# Patient Record
Sex: Male | Born: 1960 | Race: Black or African American | Hispanic: No | Marital: Single | Smoking: Current some day smoker
Health system: Southern US, Community
[De-identification: ages and names within clinical notes are randomized; demographics above are authoritative.]

## PROBLEM LIST (undated history)

## (undated) HISTORY — PX: ABDOMINAL SURGERY: SHX537

---

## 2001-08-06 ENCOUNTER — Emergency Department (HOSPITAL_COMMUNITY): Admission: EM | Admit: 2001-08-06 | Discharge: 2001-08-06 | Payer: Self-pay | Admitting: Emergency Medicine

## 2003-06-15 ENCOUNTER — Emergency Department (HOSPITAL_COMMUNITY): Admission: EM | Admit: 2003-06-15 | Discharge: 2003-06-16 | Payer: Self-pay | Admitting: Emergency Medicine

## 2010-04-30 ENCOUNTER — Emergency Department (HOSPITAL_BASED_OUTPATIENT_CLINIC_OR_DEPARTMENT_OTHER): Admission: EM | Admit: 2010-04-30 | Discharge: 2010-04-30 | Payer: Self-pay | Admitting: Emergency Medicine

## 2010-07-30 ENCOUNTER — Emergency Department (HOSPITAL_BASED_OUTPATIENT_CLINIC_OR_DEPARTMENT_OTHER)
Admission: EM | Admit: 2010-07-30 | Discharge: 2010-07-30 | Disposition: A | Discharge status: Home / Self Care | Payer: Self-pay | Attending: Emergency Medicine | Admitting: Emergency Medicine

## 2010-07-30 DIAGNOSIS — F17200 Nicotine dependence, unspecified, uncomplicated: Secondary | ICD-10-CM | POA: Insufficient documentation

## 2010-07-30 DIAGNOSIS — J029 Acute pharyngitis, unspecified: Secondary | ICD-10-CM | POA: Insufficient documentation

## 2010-07-30 LAB — RAPID STREP SCREEN (MED CTR MEBANE ONLY): Streptococcus, Group A Screen (Direct): NEGATIVE

## 2010-07-31 LAB — STREP A DNA PROBE: Group A Strep Probe: NEGATIVE

## 2010-10-02 ENCOUNTER — Emergency Department (HOSPITAL_COMMUNITY)
Admission: EM | Admit: 2010-10-02 | Discharge: 2010-10-03 | Disposition: A | Discharge status: Home / Self Care | Payer: Self-pay | Attending: Emergency Medicine | Admitting: Emergency Medicine

## 2010-10-02 DIAGNOSIS — R197 Diarrhea, unspecified: Secondary | ICD-10-CM | POA: Insufficient documentation

## 2010-10-02 DIAGNOSIS — R112 Nausea with vomiting, unspecified: Secondary | ICD-10-CM | POA: Insufficient documentation

## 2010-10-02 DIAGNOSIS — D72829 Elevated white blood cell count, unspecified: Secondary | ICD-10-CM | POA: Insufficient documentation

## 2010-10-02 DIAGNOSIS — R109 Unspecified abdominal pain: Secondary | ICD-10-CM | POA: Insufficient documentation

## 2010-10-03 ENCOUNTER — Emergency Department (HOSPITAL_COMMUNITY): Payer: Self-pay

## 2010-10-03 ENCOUNTER — Encounter (HOSPITAL_COMMUNITY): Payer: Self-pay

## 2010-10-03 LAB — DIFFERENTIAL
Basophils Relative: 0 % (ref 0–1)
Eosinophils Relative: 0 % (ref 0–5)
Lymphs Abs: 3.5 10*3/uL (ref 0.7–4.0)
Monocytes Absolute: 1 10*3/uL (ref 0.1–1.0)
Neutrophils Relative %: 78 % — ABNORMAL HIGH (ref 43–77)

## 2010-10-03 LAB — COMPREHENSIVE METABOLIC PANEL
Alkaline Phosphatase: 58 U/L (ref 39–117)
BUN: 14 mg/dL (ref 6–23)
CO2: 29 mEq/L (ref 19–32)
GFR calc non Af Amer: >60 mL/min (ref >60)
Glucose, Bld: 141 mg/dL — ABNORMAL HIGH (ref 70–99)
Potassium: 4.2 mEq/L (ref 3.5–5.1)
Total Bilirubin: 0.6 mg/dL (ref 0.3–1.2)
Total Protein: 7.1 g/dL (ref 6.0–8.3)

## 2010-10-03 LAB — CBC
HCT: 43.8 % (ref 39.0–52.0)
MCH: 21.8 pg — ABNORMAL LOW (ref 26.0–34.0)
MCV: 67.4 fL — ABNORMAL LOW (ref 78.0–100.0)
Platelets: 249 10*3/uL (ref 150–400)
RBC: 6.5 MIL/uL — ABNORMAL HIGH (ref 4.22–5.81)

## 2010-10-03 LAB — LIPASE, BLOOD: Lipase: 34 U/L (ref 11–59)

## 2010-10-03 MED ORDER — IOHEXOL 300 MG/ML  SOLN
100.0000 mL | Freq: Once | INTRAMUSCULAR | Status: AC | PRN
  Administered 2010-10-03: 100 mL via INTRAVENOUS

## 2012-05-07 ENCOUNTER — Encounter (HOSPITAL_BASED_OUTPATIENT_CLINIC_OR_DEPARTMENT_OTHER): Payer: Self-pay | Admitting: *Deleted

## 2012-05-07 ENCOUNTER — Emergency Department (HOSPITAL_BASED_OUTPATIENT_CLINIC_OR_DEPARTMENT_OTHER)
Admission: EM | Admit: 2012-05-07 | Discharge: 2012-05-07 | Disposition: A | Discharge status: Home / Self Care | Payer: Self-pay | Attending: Emergency Medicine | Admitting: Emergency Medicine

## 2012-05-07 DIAGNOSIS — L02219 Cutaneous abscess of trunk, unspecified: Secondary | ICD-10-CM | POA: Insufficient documentation

## 2012-05-07 DIAGNOSIS — L03319 Cellulitis of trunk, unspecified: Secondary | ICD-10-CM | POA: Insufficient documentation

## 2012-05-07 DIAGNOSIS — F17200 Nicotine dependence, unspecified, uncomplicated: Secondary | ICD-10-CM | POA: Insufficient documentation

## 2012-05-07 DIAGNOSIS — L0291 Cutaneous abscess, unspecified: Secondary | ICD-10-CM

## 2012-05-07 MED ORDER — DOXYCYCLINE HYCLATE 100 MG PO TABS: 100.0000 mg | ORAL_TABLET | Freq: Two times a day (BID) | ORAL | Status: DC

## 2012-05-07 MED ORDER — LIDOCAINE HCL 2 % IJ SOLN
10.0000 mL | Freq: Once | INTRAMUSCULAR | Status: AC
  Administered 2012-05-07: 100 mg via INTRADERMAL
  Filled 2012-05-07: qty 20

## 2012-05-07 MED ORDER — NAPROXEN 500 MG PO TABS: 500.0000 mg | ORAL_TABLET | Freq: Two times a day (BID) | ORAL | Status: DC

## 2012-05-07 NOTE — ED Provider Notes (Signed)
History    CSN: [id number] Arrival date & time 05/07/12  1632First MD Initiated Contact with Patient 05/07/12 1643     Chief Complaint  Patient presents with  . Abscess    HPI The patient states he's had a sore on his abdomen for the last 2 weeks. It started off as a small bump. It has been slowly increasing in size since it started.  The patient has been applying fatback onto the wound.  He denies any fevers or chills. He denies any abdominal pain vomiting or diarrhea.  History reviewed. No pertinent past medical history.  History reviewed. No pertinent past surgical history.  No family history on file.  History  Substance Use Topics  . Smoking status: Current Every Day Smoker    Types: Cigarettes  . Smokeless tobacco: Not on file  . Alcohol Use: No      Review of Systems  All other systems reviewed and are negative.    Allergies  Review of patient's allergies indicates no known allergies.  Home Medications   Current Outpatient Rx  Name  Route  Sig  Dispense  Refill  . DOXYCYCLINE HYCLATE 100 MG PO TABS   Oral   Take 1 tablet (100 mg total) by mouth 2 (two) times daily.   14 tablet   0   . NAPROXEN 500 MG PO TABS   Oral   Take 1 tablet (500 mg total) by mouth 2 (two) times daily with a meal.   14 tablet   0     BP 131/83  Pulse 67  Temp 98 F (36.7 C) (Oral)  Resp 16  SpO2 100%  Physical Exam  Nursing note and vitals reviewed. Constitutional: He appears well-developed and well-nourished. No distress.  HENT:  Head: Normocephalic and atraumatic.  Right Ear: External ear normal.  Left Ear: External ear normal.  Eyes: Conjunctivae normal are normal. Right eye exhibits no discharge. Left eye exhibits no discharge. No scleral icterus.  Neck: Neck supple. No tracheal deviation present.  Cardiovascular: Normal rate, regular rhythm and intact distal pulses.   Pulmonary/Chest: Effort normal and breath sounds normal. No stridor. No respiratory distress. He  has no wheezes. He has no rales.  Abdominal: Soft. Bowel sounds are normal. He exhibits no distension. There is tenderness. There is no rebound and no guarding.       Small approximately 1-2 cm area of induration with a central pustule, mild amount of surrounding erythema  Musculoskeletal: He exhibits no edema and no tenderness.  Neurological: He is alert. He has normal strength. No sensory deficit. Cranial nerve deficit:  no gross defecits noted. He exhibits normal muscle tone. He displays no seizure activity. Coordination normal.  Skin: Skin is warm and dry. No rash noted.  Psychiatric: He has a normal mood and affect.    ED Course  INCISION AND DRAINAGE Performed by: Linwood Dibbles R Authorized by: Linwood Dibbles R Consent: Verbal consent obtained. Risks and benefits: risks, benefits and alternatives were discussed Consent given by: patient Patient identity confirmed: verbally with patient Time out: Immediately prior to procedure a "time out" was called to verify the correct patient, procedure, equipment, support staff and site/side marked as required. Type: abscess Body area: trunk Location details: abdomen Anesthesia: local infiltration Local anesthetic: lidocaine 2% without epinephrine Patient sedated: no Scalpel size: 11 Incision type: single straight Complexity: complex Drainage: purulent Drainage amount: scant Packing material: 1/4 in gauze Patient tolerance: Patient tolerated the procedure well with no immediate complications.   (  including critical care time)  Labs Reviewed - No data to display No results found.   1. Abscess       MDM  The patient appears to have a simple abscess without complications. The wound was probed to break up any loculations. Small amount of packing was inserted into the wound. Patient was instructed continue warm compresses. He will followup in 2 days to have the wound rechecked office completely better she removed the packing himself    Prescriptions were written for doxycycline and Naprosyn        Celene Kras, MD 05/07/12 1736

## 2012-05-07 NOTE — ED Notes (Signed)
Pt reports abscess to mid stomach x 2 weeks. Draining. Pt has been putting "fat back meat" on site to help with drainage. Pain to surrounding area.

## 2013-04-18 ENCOUNTER — Emergency Department (HOSPITAL_BASED_OUTPATIENT_CLINIC_OR_DEPARTMENT_OTHER): Payer: Self-pay

## 2013-04-18 ENCOUNTER — Encounter (HOSPITAL_BASED_OUTPATIENT_CLINIC_OR_DEPARTMENT_OTHER): Payer: Self-pay | Admitting: Emergency Medicine

## 2013-04-18 ENCOUNTER — Emergency Department (HOSPITAL_BASED_OUTPATIENT_CLINIC_OR_DEPARTMENT_OTHER)
Admission: EM | Admit: 2013-04-18 | Discharge: 2013-04-18 | Disposition: A | Discharge status: Home / Self Care | Payer: Self-pay | Attending: Emergency Medicine | Admitting: Emergency Medicine

## 2013-04-18 DIAGNOSIS — R059 Cough, unspecified: Secondary | ICD-10-CM | POA: Insufficient documentation

## 2013-04-18 DIAGNOSIS — R05 Cough: Secondary | ICD-10-CM | POA: Insufficient documentation

## 2013-04-18 DIAGNOSIS — R51 Headache: Secondary | ICD-10-CM | POA: Insufficient documentation

## 2013-04-18 DIAGNOSIS — R000 Tachycardia, unspecified: Secondary | ICD-10-CM | POA: Insufficient documentation

## 2013-04-18 DIAGNOSIS — J029 Acute pharyngitis, unspecified: Secondary | ICD-10-CM | POA: Insufficient documentation

## 2013-04-18 DIAGNOSIS — M545 Low back pain, unspecified: Secondary | ICD-10-CM | POA: Insufficient documentation

## 2013-04-18 DIAGNOSIS — Z792 Long term (current) use of antibiotics: Secondary | ICD-10-CM | POA: Insufficient documentation

## 2013-04-18 DIAGNOSIS — F17200 Nicotine dependence, unspecified, uncomplicated: Secondary | ICD-10-CM | POA: Insufficient documentation

## 2013-04-18 DIAGNOSIS — R509 Fever, unspecified: Secondary | ICD-10-CM | POA: Insufficient documentation

## 2013-04-18 DIAGNOSIS — R42 Dizziness and giddiness: Secondary | ICD-10-CM | POA: Insufficient documentation

## 2013-04-18 DIAGNOSIS — Z791 Long term (current) use of non-steroidal anti-inflammatories (NSAID): Secondary | ICD-10-CM | POA: Insufficient documentation

## 2013-04-18 DIAGNOSIS — Z79899 Other long term (current) drug therapy: Secondary | ICD-10-CM | POA: Insufficient documentation

## 2013-04-18 DIAGNOSIS — R109 Unspecified abdominal pain: Secondary | ICD-10-CM | POA: Insufficient documentation

## 2013-04-18 LAB — URINALYSIS, ROUTINE W REFLEX MICROSCOPIC
Ketones, ur: 15 mg/dL — AB
Nitrite: NEGATIVE
Protein, ur: 30 mg/dL — AB
Urobilinogen, UA: >8 mg/dL — ABNORMAL HIGH (ref 0.0–1.0)
pH: 7 (ref 5.0–8.0)

## 2013-04-18 LAB — CBC WITH DIFFERENTIAL/PLATELET
Basophils Absolute: 0 10*3/uL (ref 0.0–0.1)
Basophils Relative: 0 % (ref 0–1)
HCT: 37.4 % — ABNORMAL LOW (ref 39.0–52.0)
Hemoglobin: 12.5 g/dL — ABNORMAL LOW (ref 13.0–17.0)
Lymphocytes Relative: 7 % — ABNORMAL LOW (ref 12–46)
Lymphs Abs: 1.6 10*3/uL (ref 0.7–4.0)
MCH: 22.1 pg — ABNORMAL LOW (ref 26.0–34.0)
MCV: 66.1 fL — ABNORMAL LOW (ref 78.0–100.0)
Monocytes Relative: 8 % (ref 3–12)
RBC: 5.66 MIL/uL (ref 4.22–5.81)
WBC: 21.6 10*3/uL — ABNORMAL HIGH (ref 4.0–10.5)

## 2013-04-18 LAB — RAPID STREP SCREEN (MED CTR MEBANE ONLY): Streptococcus, Group A Screen (Direct): NEGATIVE

## 2013-04-18 LAB — COMPREHENSIVE METABOLIC PANEL
Albumin: 3.2 g/dL — ABNORMAL LOW (ref 3.5–5.2)
Alkaline Phosphatase: 63 U/L (ref 39–117)
BUN: 19 mg/dL (ref 6–23)
Potassium: 4.1 mEq/L (ref 3.5–5.1)
Total Protein: 6.8 g/dL (ref 6.0–8.3)

## 2013-04-18 LAB — LIPASE, BLOOD: Lipase: 31 U/L (ref 11–59)

## 2013-04-18 MED ORDER — ACETAMINOPHEN 325 MG PO TABS
650.0000 mg | ORAL_TABLET | Freq: Once | ORAL | Status: AC
  Administered 2013-04-18: 650 mg via ORAL
  Filled 2013-04-18: qty 2

## 2013-04-18 MED ORDER — ONDANSETRON HCL 4 MG/2ML IJ SOLN
4.0000 mg | Freq: Once | INTRAMUSCULAR | Status: AC
  Administered 2013-04-18: 4 mg via INTRAVENOUS
  Filled 2013-04-18: qty 2

## 2013-04-18 MED ORDER — PENICILLIN V POTASSIUM 500 MG PO TABS: 500.0000 mg | ORAL_TABLET | Freq: Four times a day (QID) | ORAL | Status: DC

## 2013-04-18 MED ORDER — CEFTRIAXONE SODIUM 1 G IJ SOLR
INTRAMUSCULAR | Status: AC
  Filled 2013-04-18: qty 10

## 2013-04-18 MED ORDER — SODIUM CHLORIDE 0.9 % IV SOLN
INTRAVENOUS | Status: DC
  Administered 2013-04-18: 100 mL/h via INTRAVENOUS

## 2013-04-18 MED ORDER — HYDROCODONE-ACETAMINOPHEN 5-325 MG PO TABS: 1.0000 | ORAL_TABLET | Freq: Four times a day (QID) | ORAL | Status: DC | PRN

## 2013-04-18 MED ORDER — IOHEXOL 300 MG/ML  SOLN
80.0000 mL | Freq: Once | INTRAMUSCULAR | Status: AC | PRN
  Administered 2013-04-18: 80 mL via INTRAVENOUS

## 2013-04-18 MED ORDER — HYDROMORPHONE HCL PF 1 MG/ML IJ SOLN
1.0000 mg | Freq: Once | INTRAMUSCULAR | Status: AC
  Administered 2013-04-18: 1 mg via INTRAVENOUS
  Filled 2013-04-18: qty 1

## 2013-04-18 MED ORDER — SODIUM CHLORIDE 0.9 % IV BOLUS (SEPSIS)
500.0000 mL | Freq: Once | INTRAVENOUS | Status: AC
  Administered 2013-04-18: 500 mL via INTRAVENOUS

## 2013-04-18 MED ORDER — DEXTROSE 5 % IV SOLN
1.0000 g | Freq: Once | INTRAVENOUS | Status: AC
  Administered 2013-04-18: 1 g via INTRAVENOUS

## 2013-04-18 NOTE — ED Notes (Addendum)
Fever and left flank pain x 1 day; cough productive for yellow sputum x 1 day

## 2013-04-18 NOTE — ED Notes (Signed)
Patient transported to X-ray via stretcher per tech. 

## 2013-04-18 NOTE — ED Provider Notes (Signed)
CSN: [id number]     Arrival date & time 04/18/13  1734 History  This chart was scribed for Shelda Jakes, MD by Dorothey Baseman, ED Scribe. This patient was seen in room MH10/MH10 and the patient's care was started at 7:38 PM.    Chief Complaint  Patient presents with  . Fever  . Flank Pain   Patient is a 52 y.o. male presenting with fever and back pain. The history is provided by the patient. No language interpreter was used.  Fever Severity:  Moderate Onset quality:  Sudden Associated symptoms: chills, cough, headaches and sore throat   Associated symptoms: no chest pain, no diarrhea, no dysuria, no myalgias, no nausea, no rash, no rhinorrhea and no vomiting   Risk factors: no sick contacts   Back Pain Location:  Lumbar spine (right sided) Radiates to:  Does not radiate Pain severity:  Mild Onset quality:  Gradual Timing:  Constant Chronicity:  New Context: not recent injury   Relieved by:  Being still and lying down Worsened by:  Lying down Associated symptoms: fever and headaches   Associated symptoms: no abdominal pain, no chest pain and no dysuria    HPI Comments: Walter Richards is a 52 y.o. male who presents to the Emergency Department complaining of fever (101.6 measured in the ED) onset yesterday, around 25 hours ago, with associated chills, productive cough (yellow sputum), severe sore throat (10/10 currently), mild headache, and lightheadedness. He reports taking Aleve and ibuprofen at home without relief. Patient also reports a non-radiating, right-sided back pain, 3/10 currently, that is relieved by laying on his side and exacerbated when laying flat. He states this back pain is new for him and denies any potential injury to the area. He denies visual disturbance, rhinorrhea, chest pain, shortness of breath, abdominal pain, nausea, emesis, diarrhea, dysuria, hematuria, neck pain, rash, myalgias. He denies any sick contacts. He denies history of kidney stones or any other  pertinent medical history.  History reviewed. No pertinent past medical history. History reviewed. No pertinent past surgical history. No family history on file. History  Substance Use Topics  . Smoking status: Current Some Day Smoker    Types: Cigarettes  . Smokeless tobacco: Never Used  . Alcohol Use: No    Review of Systems  Constitutional: Positive for fever and chills.  HENT: Positive for sore throat. Negative for rhinorrhea.   Eyes: Negative for visual disturbance.  Respiratory: Positive for cough. Negative for shortness of breath.   Cardiovascular: Negative for chest pain.  Gastrointestinal: Negative for nausea, vomiting, abdominal pain and diarrhea.  Genitourinary: Negative for dysuria and hematuria.  Musculoskeletal: Positive for back pain. Negative for myalgias and neck pain.  Skin: Negative for rash.  Neurological: Positive for light-headedness and headaches.    Allergies  Review of patient's allergies indicates no known allergies.  Home Medications   Current Outpatient Rx  Name  Route  Sig  Dispense  Refill  . doxycycline (VIBRA-TABS) 100 MG tablet   Oral   Take 1 tablet (100 mg total) by mouth 2 (two) times daily.   14 tablet   0   . HYDROcodone-acetaminophen (NORCO/VICODIN) 5-325 MG per tablet   Oral   Take 1-2 tablets by mouth every 6 (six) hours as needed for moderate pain.   20 tablet   0   . naproxen (NAPROSYN) 500 MG tablet   Oral   Take 1 tablet (500 mg total) by mouth 2 (two) times daily with a meal.  14 tablet   0   . penicillin v potassium (VEETID) 500 MG tablet   Oral   Take 1 tablet (500 mg total) by mouth 4 (four) times daily.   28 tablet   0    Triage Vitals: BP 105/80  Pulse 97  Temp(Src) 101.6 F (38.7 C) (Oral)  Resp 18  Ht 5\' 9"  (1.753 m)  Wt 131 lb (59.421 kg)  BMI 19.34 kg/m2  SpO2 100%  Physical Exam  Nursing note and vitals reviewed. Constitutional: He is oriented to person, place, and time. He appears  well-developed and well-nourished. No distress.  HENT:  Head: Normocephalic and atraumatic.  Increased swelling and erythema on the right side of the pharynx.  Eyes: Conjunctivae are normal.  Neck: Normal range of motion. Neck supple.  Cardiovascular: Regular rhythm and normal heart sounds.  Exam reveals no gallop and no friction rub.   No murmur heard. Tachycardic.  Pulmonary/Chest: Effort normal and breath sounds normal. No respiratory distress. He has no wheezes. He has no rales.  Abdominal: Soft. Bowel sounds are normal. He exhibits no distension. There is no tenderness.  Musculoskeletal: Normal range of motion. He exhibits no edema.  Lymphadenopathy:    He has no cervical adenopathy.  Neurological: He is alert and oriented to person, place, and time. No cranial nerve deficit. He exhibits normal muscle tone. Coordination normal.  Skin: Skin is warm and dry.  Psychiatric: He has a normal mood and affect. His behavior is normal.    ED Course  Procedures (including critical care time)  Medications  0.9 %  sodium chloride infusion (100 mL/hr Intravenous New Bag/Given 04/18/13 2015)  cefTRIAXone (ROCEPHIN) 1 g in dextrose 5 % 50 mL IVPB (1 g Intravenous New Bag/Given 04/18/13 2254)  cefTRIAXone (ROCEPHIN) 1 G injection (not administered)  acetaminophen (TYLENOL) tablet 650 mg (650 mg Oral Given 04/18/13 1824)  sodium chloride 0.9 % bolus 500 mL (0 mLs Intravenous Stopped 04/18/13 2123)  HYDROmorphone (DILAUDID) injection 1 mg (1 mg Intravenous Given 04/18/13 2016)  ondansetron (ZOFRAN) injection 4 mg (4 mg Intravenous Given 04/18/13 2016)  iohexol (OMNIPAQUE) 300 MG/ML solution 80 mL (80 mLs Intravenous Contrast Given 04/18/13 2143)    DIAGNOSTIC STUDIES: Oxygen Saturation is 100% on room air, normal by my interpretation.    COORDINATION OF CARE: 7:45 PM- Ordered UA, blood labs, CTs of the neck and abdomen, and a chest x-ray. Will order medication to manage symptoms. Discussed treatment  plan with patient at bedside and patient verbalized agreement.     Results for orders placed during the hospital encounter of 04/18/13  RAPID STREP SCREEN      Result Value Range   Streptococcus, Group A Screen (Direct) NEGATIVE  NEGATIVE  URINALYSIS, ROUTINE W REFLEX MICROSCOPIC      Result Value Range   Color, Urine AMBER (*) YELLOW   APPearance CLEAR  CLEAR   Specific Gravity, Urine 1.029  1.005 - 1.030   pH 7.0  5.0 - 8.0   Glucose, UA NEGATIVE  NEGATIVE mg/dL   Hgb urine dipstick MODERATE (*) NEGATIVE   Bilirubin Urine SMALL (*) NEGATIVE   Ketones, ur 15 (*) NEGATIVE mg/dL   Protein, ur 30 (*) NEGATIVE mg/dL   Urobilinogen, UA >8.1 (*) 0.0 - 1.0 mg/dL   Nitrite NEGATIVE  NEGATIVE   Leukocytes, UA TRACE (*) NEGATIVE  URINE MICROSCOPIC-ADD ON      Result Value Range   Squamous Epithelial / LPF RARE  RARE   WBC, UA 0-2  <  3 WBC/hpf   RBC / HPF 11-20  <3 RBC/hpf   Bacteria, UA MANY (*) RARE   Urine-Other MUCOUS PRESENT    CBC WITH DIFFERENTIAL      Result Value Range   WBC 21.6 (*) 4.0 - 10.5 K/uL   RBC 5.66  4.22 - 5.81 MIL/uL   Hemoglobin 12.5 (*) 13.0 - 17.0 g/dL   HCT 16.1 (*) 09.6 - 04.5 %   MCV 66.1 (*) 78.0 - 100.0 fL   MCH 22.1 (*) 26.0 - 34.0 pg   MCHC 33.4  30.0 - 36.0 g/dL   RDW 40.9 (*) 81.1 - 91.4 %   Platelets 211  150 - 400 K/uL   Neutrophils Relative % 85 (*) 43 - 77 %   Neutro Abs 18.3 (*) 1.7 - 7.7 K/uL   Lymphocytes Relative 7 (*) 12 - 46 %   Lymphs Abs 1.6  0.7 - 4.0 K/uL   Monocytes Relative 8  3 - 12 %   Monocytes Absolute 1.7 (*) 0.1 - 1.0 K/uL   Eosinophils Relative 0  0 - 5 %   Eosinophils Absolute 0.0  0.0 - 0.7 K/uL   Basophils Relative 0  0 - 1 %   Basophils Absolute 0.0  0.0 - 0.1 K/uL   WBC Morphology VACUOLATED NEUTROPHILS    COMPREHENSIVE METABOLIC PANEL      Result Value Range   Sodium 136  135 - 145 mEq/L   Potassium 4.1  3.5 - 5.1 mEq/L   Chloride 100  96 - 112 mEq/L   CO2 25  19 - 32 mEq/L   Glucose, Bld 101 (*) 70 - 99  mg/dL   BUN 19  6 - 23 mg/dL   Creatinine, Ser 7.82  0.50 - 1.35 mg/dL   Calcium 9.0  8.4 - 95.6 mg/dL   Total Protein 6.8  6.0 - 8.3 g/dL   Albumin 3.2 (*) 3.5 - 5.2 g/dL   AST 11  0 - 37 U/L   ALT 8  0 - 53 U/L   Alkaline Phosphatase 63  39 - 117 U/L   Total Bilirubin 0.4  0.3 - 1.2 mg/dL   GFR calc non Af Amer >90  >90 mL/min   GFR calc Af Amer >90  >90 mL/min  LIPASE, BLOOD      Result Value Range   Lipase 31  11 - 59 U/L   Ct Abdomen Pelvis Wo Contrast  04/18/2013   CLINICAL DATA:  Right-sided flank pain, chills, productive cough, sore throat and fever.  EXAM: CT ABDOMEN AND PELVIS WITHOUT CONTRAST  TECHNIQUE: Multidetector CT imaging of the abdomen and pelvis was performed following the standard protocol without intravenous contrast.  COMPARISON:  CT of the abdomen and pelvis performed 10/03/2010  FINDINGS: The visualized lung bases are clear.  The liver and spleen are unremarkable in appearance. The gallbladder is within normal limits. The pancreas and adrenal glands are unremarkable.  The kidneys are unremarkable in appearance. There is no evidence of hydronephrosis. No renal or ureteral stones are seen. No perinephric stranding is appreciated.  No free fluid is identified. The small bowel is unremarkable in appearance. The stomach is within normal limits. No acute vascular abnormalities are seen.  The appendix is normal in caliber, without evidence for appendicitis. The sigmoid colon is redundant. The colon is unremarkable in appearance.  The bladder is mildly distended and grossly unremarkable in appearance. The prostate remains normal in size. A small urachal remnant is incidentally seen.  No inguinal lymphadenopathy is seen.  No acute osseous abnormalities are identified.  IMPRESSION: No acute abnormality seen within the abdomen or pelvis.   Electronically Signed   By: Roanna Raider M.D.   On: 04/18/2013 21:24   Dg Chest 2 View  04/18/2013   CLINICAL DATA:  Fever began yesterday with  chills and productive cough, sore throat, headache  EXAM: CHEST  2 VIEW  COMPARISON:  None.  FINDINGS: Heart size and vascular pattern are normal. Mild pleural-parenchymal scarring right lung apex. Lungs otherwise clear. No pleural effusions.  IMPRESSION: No active cardiopulmonary disease.   Electronically Signed   By: Esperanza Heir M.D.   On: 04/18/2013 20:53   Ct Soft Tissue Neck W Contrast  04/18/2013   CLINICAL DATA:  Fever, severe sore throat, productive cough, with increased swelling and erythema on the right side of the pharynx on physical exam.  EXAM: CT NECK WITH CONTRAST  TECHNIQUE: Multidetector CT imaging of the neck was performed using the standard protocol following the bolus administration of intravenous contrast.  CONTRAST:  80mL OMNIPAQUE IOHEXOL 300 MG/ML  SOLN  COMPARISON:  None available.  FINDINGS: The visualized portions of the brain are within normal limits.  Orbits are none.  The paranasal sinuses and mastoid air cells are clear.  The salivary glands including the parotid glands and submandibular glands are symmetric in size and appearance without abnormality.  The oral cavity is normal. There is mild asymmetry of the palatine tonsils with the left slightly larger than the right. There is subtle a hypodensity within the left tonsil itself (series 2, image 54) without definite well-formed fluid collection to suggest abscess. No peritonsillar abscess. Subcentimeter calcified tonsillith is noted within the right palatine tonsil. The parapharyngeal fat is preserved. The lingual tonsils are mildly prominent. The epiglottis is normal. The nasopharynx and oropharynx are otherwise unremarkable. The hypopharynx and larynx are normal. True vocal cords are symmetric in size and appearance. The subglottic airway is normal.  No enlarged adenopathy is identified within the neck. No mass lesion or loculated fluid collection. No adenopathy is seen within the visualized superior mediastinum.  The thyroid  is normal.  Prominent bullous changes with paraseptal and centrilobular emphysema are noted within the partially visualized lung apices. There is irregular parenchymal thickening with associated calcification at the right lung apex.  Normal intravascular enhancement is seen within the neck.  No acute osseous abnormality identified. No focal lytic or blastic osseous lesions are seen.  IMPRESSION: 1. Diffuse prominence of the palatine and lingual tonsils without mass lesion or loculated fluid collection to suggest abscess. This may be reactive in nature secondary to underlying infection. No significant cervical adenopathy identified.  2.  Severe emphysema, incompletely evaluated.   Electronically Signed   By: Rise Mu M.D.   On: 04/18/2013 22:10      EKG Interpretation   None       MDM   1. Pharyngitis   2. Fever    Patient with marked pharyngitis strep test negative no evidence of peritonsillar abscess. No cervical adenopathy. Marked leukocytosis some fever patient is nontoxic no acute distress no lightheadedness when standing. Will treat with Rocephin 1 g IV piggyback here and will also continue with penicillin patient pays for scripts on his own. Patient not a good age for this to be mono. Patient past medical history is negative no history of any renal compromise state. Will treat with antibiotics just in case this is strep that was missed by the  rapid strep. Formal culture is pending. Patient will return for any new or worse symptoms. Patient understands that we don't know exactly why throat [place] when the white count is so high.    I personally performed the services described in this documentation, which was scribed in my presence. The recorded information has been reviewed and is accurate.      Shelda Jakes, MD 04/18/13 2258

## 2013-04-21 LAB — CULTURE, GROUP A STREP

## 2013-06-14 ENCOUNTER — Emergency Department (HOSPITAL_BASED_OUTPATIENT_CLINIC_OR_DEPARTMENT_OTHER)
Admission: EM | Admit: 2013-06-14 | Discharge: 2013-06-14 | Disposition: A | Discharge status: Home / Self Care | Payer: Self-pay | Attending: Emergency Medicine | Admitting: Emergency Medicine

## 2013-06-14 ENCOUNTER — Encounter (HOSPITAL_BASED_OUTPATIENT_CLINIC_OR_DEPARTMENT_OTHER): Payer: Self-pay | Admitting: Emergency Medicine

## 2013-06-14 DIAGNOSIS — B9789 Other viral agents as the cause of diseases classified elsewhere: Secondary | ICD-10-CM | POA: Insufficient documentation

## 2013-06-14 DIAGNOSIS — B349 Viral infection, unspecified: Secondary | ICD-10-CM

## 2013-06-14 DIAGNOSIS — F17200 Nicotine dependence, unspecified, uncomplicated: Secondary | ICD-10-CM | POA: Insufficient documentation

## 2013-06-14 DIAGNOSIS — R51 Headache: Secondary | ICD-10-CM | POA: Insufficient documentation

## 2013-06-14 NOTE — ED Notes (Signed)
Pt states he has had sore throat and headache with a cough productive yellow sputum onset 3 days ago

## 2013-06-14 NOTE — ED Provider Notes (Signed)
CSN: [id number]     Arrival date & time 06/14/13  0704 History   First MD Initiated Contact with Patient 06/14/13 [phone]     Chief Complaint  Patient presents with  . Sore Throat    Patient is a 53 y.o. male presenting with pharyngitis. The history is provided by the patient.  Sore Throat This is a new problem. The current episode started more than 2 days ago. The problem occurs daily. The problem has been gradually worsening. Associated symptoms include headaches. Pertinent negatives include no chest pain, no abdominal pain and no shortness of breath. The symptoms are aggravated by swallowing. The symptoms are relieved by rest. He has tried acetaminophen for the symptoms. The treatment provided no relief.   Pt reports sore throat for over 4 days He now reports cough for past 2 days No vomiting/diarrhea He reports mild HA as well, reports HA proceeded all symptoms and has been present for two weeks and it has worsened with sore throat/cough symptoms No rash No focal weakness No cp/sob  PMH - none  History reviewed. No pertinent past surgical history. History reviewed. No pertinent family history. History  Substance Use Topics  . Smoking status: Current Some Day Smoker    Types: Cigarettes  . Smokeless tobacco: Never Used  . Alcohol Use: No    Review of Systems  Constitutional: Positive for fever and chills.  Respiratory: Negative for shortness of breath.   Cardiovascular: Negative for chest pain.  Gastrointestinal: Negative for abdominal pain.  Neurological: Positive for headaches. Negative for weakness.    Allergies  Review of patient's allergies indicates no known allergies.  Home Medications  No current outpatient prescriptions on file. BP 117/74  Pulse 72  Temp(Src) 98.5 F (36.9 C) (Oral)  Resp 20  SpO2 98% Physical Exam CONSTITUTIONAL: Well developed/well nourished HEAD: Normocephalic/atraumatic EYES: EOMI/PERRL ENMT: Mucous membranes moist, uvula midline,  mild erythema to pharynx noted.  No exudates, no stridor, normal phonation NECK: supple no meningeal signs CV: S1/S2 noted, no murmurs/rubs/gallops noted LUNGS: Lungs are clear to auscultation bilaterally, no apparent distress ABDOMEN: soft, nontender, no rebound or guarding GU:no cva tenderness NEURO: Pt is awake/alert, moves all extremitiesx4, gait normal without ataxia EXTREMITIES: pulses normal, full ROM SKIN: warm, color normal PSYCH: no abnormalities of mood noted  ED Course  Procedures (including critical care time) Labs Review Labs Reviewed - No data to display Imaging Review No results found.  EKG Interpretation   None       MDM   1. Viral illness    Nursing notes including past medical history and social history reviewed and considered in documentation   Suspect viral illness, flu-like illness, well appearing, stable for d/c home We discussed strict return precautions    Joya Gaskinsonald W Sakshi Sermons, MD 06/14/13 [phone]

## 2015-09-09 ENCOUNTER — Emergency Department (HOSPITAL_BASED_OUTPATIENT_CLINIC_OR_DEPARTMENT_OTHER): Payer: Self-pay

## 2015-09-09 ENCOUNTER — Encounter (HOSPITAL_BASED_OUTPATIENT_CLINIC_OR_DEPARTMENT_OTHER): Payer: Self-pay | Admitting: *Deleted

## 2015-09-09 ENCOUNTER — Emergency Department (HOSPITAL_BASED_OUTPATIENT_CLINIC_OR_DEPARTMENT_OTHER)
Admission: EM | Admit: 2015-09-09 | Discharge: 2015-09-09 | Disposition: A | Discharge status: Home / Self Care | Payer: Self-pay | Attending: Emergency Medicine | Admitting: Emergency Medicine

## 2015-09-09 DIAGNOSIS — R42 Dizziness and giddiness: Secondary | ICD-10-CM | POA: Insufficient documentation

## 2015-09-09 DIAGNOSIS — R110 Nausea: Secondary | ICD-10-CM | POA: Insufficient documentation

## 2015-09-09 DIAGNOSIS — F17210 Nicotine dependence, cigarettes, uncomplicated: Secondary | ICD-10-CM | POA: Insufficient documentation

## 2015-09-09 DIAGNOSIS — R519 Headache, unspecified: Secondary | ICD-10-CM

## 2015-09-09 DIAGNOSIS — R51 Headache: Secondary | ICD-10-CM | POA: Insufficient documentation

## 2015-09-09 MED ORDER — PROCHLORPERAZINE MALEATE 10 MG PO TABS: 10.0000 mg | ORAL_TABLET | Freq: Two times a day (BID) | ORAL | Status: AC | PRN

## 2015-09-09 NOTE — ED Provider Notes (Signed)
CSN: [id number]     Arrival date & time 09/09/15  1453 History  By signing my name below, I, Terrance Branch, attest that this documentation has been prepared under the direction and in the presence of Alvira Monday, MD. Electronically Signed: Evon Slack, ED Scribe. 09/09/2015. 4:23 PM.     Chief Complaint  Patient presents with  . Headache   The history is provided by the patient. No language interpreter was used.   HPI Comments: Walter Richards is a 55 y.o. male who presents to the Emergency Department complaining of constant throbbing HA onset 3 weeks prior. Pt states that he wakes up with HA and sometimes wakes in the middle of the night with HA's. Pt rates the severity of his HA is 5/10. Pt does report some associated nausea. Pt states that his HA is worse when bending over and standing up. He states that he intermittently feels lightheaded when standing and bending over. Pt stat Pt states that he has tried Advil and goody powders with no relief. Denies vomiting, fevers, changes in vision, numbness, weakness or neck stiffness. Denies head injury or trauma. Pt denies HX of PE or DVT's   History reviewed. No pertinent past medical history. Past Surgical History  Procedure Laterality Date  . Abdominal surgery     No family history on file. Social History  Substance Use Topics  . Smoking status: Current Some Day Smoker    Types: Cigarettes  . Smokeless tobacco: Never Used  . Alcohol Use: No    Review of Systems  Constitutional: Negative for fever.  HENT: Negative for sore throat.   Eyes: Negative for visual disturbance.  Respiratory: Negative for shortness of breath.   Cardiovascular: Negative for chest pain.  Gastrointestinal: Positive for nausea. Negative for vomiting and abdominal pain.  Genitourinary: Negative for difficulty urinating.  Musculoskeletal: Negative for back pain and neck stiffness.  Skin: Negative for rash.  Neurological: Positive for light-headedness  and headaches. Negative for syncope, weakness and numbness.  All other systems reviewed and are negative.     Allergies  Review of patient's allergies indicates no known allergies.  Home Medications   Prior to Admission medications   Medication Sig Start Date End Date Taking? Authorizing Provider  prochlorperazine (COMPAZINE) 10 MG tablet Take 1 tablet (10 mg total) by mouth 2 (two) times daily as needed for nausea or vomiting (headaches). 09/09/15   Alvira Monday, MD   BP 107/76 mmHg  Pulse 73  Temp(Src) 98.4 F (36.9 C) (Oral)  Resp 18  Ht  (1.702 m)  Wt 140 lb (63.504 kg)  BMI 21.92 kg/m2  SpO2 100%   Physical Exam  Constitutional: He is oriented to person, place, and time. He appears well-developed and well-nourished. No distress.  HENT:  Head: Normocephalic and atraumatic.  Eyes: Conjunctivae and EOM are normal. Pupils are equal, round, and reactive to light.  Neck: Normal range of motion. Neck supple. No tracheal deviation present.  Cardiovascular: Normal rate, regular rhythm, normal heart sounds and intact distal pulses.  Exam reveals no gallop and no friction rub.   No murmur heard. Pulmonary/Chest: Effort normal and breath sounds normal. No respiratory distress. He has no wheezes. He has no rales.  Abdominal: Soft. He exhibits no distension. There is no tenderness. There is no guarding.  Musculoskeletal: Normal range of motion. He exhibits no edema.  Neurological: He is alert and oriented to person, place, and time. He has normal strength. He displays no tremor. No cranial nerve  deficit or sensory deficit. Coordination and gait normal. GCS eye subscore is 4. GCS verbal subscore is 5. GCS motor subscore is 6.  Skin: Skin is warm and dry. He is not diaphoretic.  Psychiatric: He has a normal mood and affect. His behavior is normal.  Nursing note and vitals reviewed.   ED Course  Procedures (including critical care time) DIAGNOSTIC STUDIES: Oxygen Saturation is  100% on RA, normal by my interpretation.    COORDINATION OF CARE: 4:23 PM-Discussed treatment plan with pt at bedside and pt agreed to plan.     Labs Review Labs Reviewed - No data to display  Imaging Review Ct Head Wo Contrast  09/09/2015  CLINICAL DATA:  Frontal area headaches for 3-4 weeks. No known injury. EXAM: CT HEAD WITHOUT CONTRAST TECHNIQUE: Contiguous axial images were obtained from the base of the skull through the vertex without intravenous contrast. COMPARISON:  None. FINDINGS: Brain: There is mild generalized brain atrophy with commensurate dilatation of the sulci. Ventricles are normal in size and configuration. All areas of the brain demonstrate normal gray-white matter attenuation. There is no mass, hemorrhage, edema or other evidence of acute parenchymal abnormality. No extra-axial hemorrhage. Vascular: No hyperdense vessel or unexpected calcification. Skull: Negative for fracture or focal lesion. Sinuses/Orbits: Visualized upper paranasal sinuses are clear. Orbits are unremarkable, incompletely imaged. Other: Superficial soft tissues are unremarkable. IMPRESSION: Negative head CT. Electronically Signed   By: Bary RichardStan  Maynard M.D.   On: 09/09/2015 16:11   I have personally reviewed and evaluated these images as part of my medical decision-making.   EKG Interpretation None      MDM   Final diagnoses:  Acute nonintractable headache, unspecified headache type   55 yo male with no significant medical history presents with concern for 3 weeks of headache which sometimes wakes him at night. CT within normal limits. He does not have risk factors to suggest dural venous thrombosis. No fever and doubt meningitis. Hx not consistent with SAH. No sinus symptoms.  Patient stable for outpatient PCP evaluation and management. Given rx for compazine to take with benadryl at home. Patient discharged in stable condition with understanding of reasons to return.    I personally performed the  services described in this documentation, which was scribed in my presence. The recorded information has been reviewed and is accurate.    Alvira MondayErin Italia Wolfert, MD 09/10/15 1150

## 2015-09-09 NOTE — ED Notes (Signed)
Reports 4 weeks of headaches, worse when he bends over. Denies, illness, injury, or travel

## 2015-09-09 NOTE — Discharge Instructions (Signed)

## 2018-05-25 ENCOUNTER — Emergency Department (HOSPITAL_BASED_OUTPATIENT_CLINIC_OR_DEPARTMENT_OTHER)
Admission: EM | Admit: 2018-05-25 | Discharge: 2018-05-25 | Disposition: A | Discharge status: Home / Self Care | Payer: 59 | Attending: Emergency Medicine | Admitting: Emergency Medicine

## 2018-05-25 ENCOUNTER — Other Ambulatory Visit: Payer: Self-pay

## 2018-05-25 ENCOUNTER — Encounter (HOSPITAL_BASED_OUTPATIENT_CLINIC_OR_DEPARTMENT_OTHER): Payer: Self-pay

## 2018-05-25 DIAGNOSIS — F17210 Nicotine dependence, cigarettes, uncomplicated: Secondary | ICD-10-CM | POA: Insufficient documentation

## 2018-05-25 DIAGNOSIS — Z79899 Other long term (current) drug therapy: Secondary | ICD-10-CM | POA: Insufficient documentation

## 2018-05-25 DIAGNOSIS — R21 Rash and other nonspecific skin eruption: Secondary | ICD-10-CM | POA: Diagnosis present

## 2018-05-25 NOTE — ED Triage Notes (Signed)
Pt reports pruritic rash on extremities x 4 days. Pt is using cortisone cream with "some" relief.

## 2018-05-25 NOTE — ED Provider Notes (Signed)
[facility] EMERGENCY DEPARTMENT Provider Note   CSN: [id number] Arrival date & time: 05/25/18  1712     History   Chief Complaint Chief Complaint  Patient presents with  . Rash    HPI Walter Richards is a 57 y.o. male.  The history is provided by the patient.  Rash   This is a new problem. The current episode started more than 1 week ago. The problem has not changed since onset.The problem is associated with an unknown factor. There has been no fever. The fever has been present for less than 1 day. The rash is present on the groin, left lower leg, right lower leg and right arm. The pain is at a severity of 0/10. The patient is experiencing no pain. Associated symptoms include itching. He has tried steriods for the symptoms. The treatment provided mild relief.    History reviewed. No pertinent past medical history.  There are no active problems to display for this patient.   Past Surgical History:  Procedure Laterality Date  . ABDOMINAL SURGERY          Home Medications    Prior to Admission medications   Medication Sig Start Date End Date Taking? Authorizing Provider  prochlorperazine (COMPAZINE) 10 MG tablet Take 1 tablet (10 mg total) by mouth 2 (two) times daily as needed for nausea or vomiting (headaches). 09/09/15   Alvira MondaySchlossman, Erin, MD    Family History No family history on file.  Social History Social History   Tobacco Use  . Smoking status: Current Some Day Smoker    Types: Cigarettes  . Smokeless tobacco: Never Used  Substance Use Topics  . Alcohol use: No  . Drug use: No     Allergies   Patient has no known allergies.   Review of Systems Review of Systems  Constitutional: Negative for chills and fever.  HENT: Negative for ear pain and sore throat.   Eyes: Negative for pain and visual disturbance.  Respiratory: Negative for cough and shortness of breath.   Cardiovascular: Negative for chest pain and palpitations.    Gastrointestinal: Negative for abdominal pain and vomiting.  Genitourinary: Negative for dysuria and hematuria.  Musculoskeletal: Negative for arthralgias and back pain.  Skin: Positive for itching and rash. Negative for color change.  Neurological: Negative for seizures and syncope.  All other systems reviewed and are negative.    Physical Exam Updated Vital Signs BP 131/84 (BP Location: Left Arm)   Pulse (!) 58   Temp 98.6 F (37 C) (Oral)   Resp 16   Ht 5\' 9"  (1.753 m)   Wt 62.1 kg   SpO2 100%   BMI 20.23 kg/m   Physical Exam Vitals signs and nursing note reviewed.  Constitutional:      Appearance: He is well-developed.  HENT:     Head: Normocephalic and atraumatic.     Nose: Nose normal.     Mouth/Throat:     Mouth: Mucous membranes are moist.  Eyes:     Extraocular Movements: Extraocular movements intact.     Conjunctiva/sclera: Conjunctivae normal.     Pupils: Pupils are equal, round, and reactive to light.  Neck:     Musculoskeletal: Normal range of motion and neck supple.  Cardiovascular:     Rate and Rhythm: Normal rate and regular rhythm.     Pulses: Normal pulses.     Heart sounds: Normal heart sounds. No murmur.  Pulmonary:     Effort: Pulmonary effort is normal.  No respiratory distress.     Breath sounds: Normal breath sounds.  Abdominal:     Palpations: Abdomen is soft.     Tenderness: There is no abdominal tenderness.  Skin:    General: Skin is warm and dry.     Findings: Rash present.     Comments: Patient with nonpalpable, non-purpuric rash to right arm, bilateral groin, right lower leg, no redness/cellulitic changes  Neurological:     Mental Status: He is alert.      ED Treatments / Results  Labs (all labs ordered are listed, but only abnormal results are displayed) Labs Reviewed - No data to display  EKG None  Radiology No results found.  Procedures Procedures (including critical care time)  Medications Ordered in  ED Medications - No data to display   Initial Impression / Assessment and Plan / ED Course  I have reviewed the triage vital signs and the nursing notes.  Pertinent labs & imaging results that were available during my care of the patient were reviewed by me and considered in my medical decision making (see chart for details).     Walter Richards is a 57 year old male with no significant medical history presents to the ED with a rash.  Patient with normal vitals.  No fever.  Patient with rash for the last several weeks.  Mostly here due to itchiness.  Patient has used topical steroid cream with some relief.  Has not been taking any Benadryl.  Rash overall is non-concerning.  There is no concern for Stevens-Johnson syndrome, erythroderma, bullous pemphigoid or scabies, RMSF, TEN.  Does not appear to be purpuric. No involvement of mucosa. No concern for cellulitis.  Recommend Benadryl and follow-up with primary care doctor.  May need to see dermatologist. Could benefit from biopsy and given that recommendation to follow up. Given reassurance and discharged from ED in good condition.  Told to return to the ED if symptoms worsen.   This chart was dictated using voice recognition software.  Despite best efforts to proofread,  errors can occur which can change the documentation meaning.   Final Clinical Impressions(s) / ED Diagnoses   Final diagnoses:  Rash    ED Discharge Orders    None       Virgina Norfolk, DO 05/26/18 0122

## 2018-05-25 NOTE — Discharge Instructions (Addendum)
Use benadryl, follow up with primary doctor.

## 2018-06-16 ENCOUNTER — Ambulatory Visit (HOSPITAL_BASED_OUTPATIENT_CLINIC_OR_DEPARTMENT_OTHER)
Admission: RE | Admit: 2018-06-16 | Discharge: 2018-06-16 | Disposition: A | Discharge status: Home / Self Care | Payer: 59 | Source: Ambulatory Visit | Attending: Medical | Admitting: Medical

## 2018-06-16 ENCOUNTER — Ambulatory Visit: Payer: 59 | Admitting: Medical

## 2018-06-16 ENCOUNTER — Encounter: Payer: Self-pay | Admitting: Medical

## 2018-06-16 VITALS — BP 106/64 | HR 89 | Temp 98.5°F | Resp 16 | Ht 69.0 in | Wt 120.6 lb

## 2018-06-16 DIAGNOSIS — R21 Rash and other nonspecific skin eruption: Secondary | ICD-10-CM | POA: Diagnosis not present

## 2018-06-16 DIAGNOSIS — Z87891 Personal history of nicotine dependence: Secondary | ICD-10-CM

## 2018-06-16 MED ORDER — TRIAMCINOLONE ACETONIDE 0.1 % EX CREA: 1.0000 "application " | TOPICAL_CREAM | Freq: Two times a day (BID) | CUTANEOUS | 0 refills | Status: AC

## 2018-06-16 NOTE — Progress Notes (Signed)
Subjective:    Patient ID: Walter Richards, male    DOB: 1961-04-16, 58 y.o.   MRN: [id number]  HPI  Pt in for first time.  Pt appears to had prior care mostly in ED.  Works at [employer], He does not work out/exercise other than work. Pt drinks 3-4 mountain dews a day. Pt states eating healthier recently than in past. Smoke at least 3 cigarettes a day. Pt restarted smoking  For past 3 years. Pt has stopped and started in past. Pt started smoking again when mom passed. No alcohol use.  Pt in some scattered hyperpigmented areas on legs that itch. These area are new for about one month. He is not sure what caused the rash. ED thought maybe benefit from biopsy.     Review of Systems  Constitutional: Negative for chills, fatigue and fever.  Respiratory: Negative for cough, chest tightness, shortness of breath and wheezing.   Cardiovascular: Negative for chest pain and palpitations.  Gastrointestinal: Negative for abdominal pain.  Skin: Positive for rash.       See hpi,  Hematological: Negative for adenopathy. Does not bruise/bleed easily.  Psychiatric/Behavioral: Negative for behavioral problems and decreased concentration.   No past medical history on file.   Social History   Socioeconomic History  . Marital status: Single    Spouse name: Not on file  . Number of children: Not on file  . Years of education: Not on file  . Highest education level: Not on file  Occupational History  . Not on file  Social Needs  . Financial resource strain: Not on file  . Food insecurity:    Worry: Not on file    Inability: Not on file  . Transportation needs:    Medical: Not on file    Non-medical: Not on file  Tobacco Use  . Smoking status: Current Some Day Smoker    Types: Cigarettes  . Smokeless tobacco: Never Used  Substance and Sexual Activity  . Alcohol use: No  . Drug use: No  . Sexual activity: Not on file  Lifestyle  . Physical activity:    Days per week: Not on file      Minutes per session: Not on file  . Stress: Not on file  Relationships  . Social connections:    Talks on phone: Not on file    Gets together: Not on file    Attends religious service: Not on file    Active member of club or organization: Not on file    Attends meetings of clubs or organizations: Not on file    Relationship status: Not on file  . Intimate partner violence:    Fear of current or ex partner: Not on file    Emotionally abused: Not on file    Physically abused: Not on file    Forced sexual activity: Not on file  Other Topics Concern  . Not on file  Social History Narrative  . Not on file    Past Surgical History:  Procedure Laterality Date  . ABDOMINAL SURGERY      Family History  Problem Relation Age of Onset  . Hypertension Mother   . Cancer Father     No Known Allergies  Current Outpatient Medications on File Prior to Visit  Medication Sig Dispense Refill  . prochlorperazine (COMPAZINE) 10 MG tablet Take 1 tablet (10 mg total) by mouth 2 (two) times daily as needed for nausea or vomiting (headaches). 10 tablet 0  No current facility-administered medications on file prior to visit.     BP 106/64   Pulse 89   Temp 98.5 F (36.9 C) (Oral)   Resp 16   Ht 5\' 9"  (1.753 m)   Wt 120 lb 9.6 oz (54.7 kg)   SpO2 100%   BMI 17.81 kg/m      Objective:   Physical Exam  General- No acute distress. Pleasant patient.  Lungs- Clear, even and unlabored. Heart- regular rate and rhythm. Neurologic- CNII- XII grossly intact.  Skin- some hyperpigmented area on calf, back of hamstring, upper thighs and groin area. Some area feel dry. Area on behind rt thigh has faint  lichenified appearanc.(total areas number about 10-12. Average diameter about 10-12 mm)      Assessment & Plan:  For your recent rash, I want you to use Kenalog cream thin film twice daily to areas, use moisturizer twice daily such as Aveeno or Lubriderm, and use Dove moisturizing soap.   We will see how your areas respond to treatment.  But due to the number of areas and new appearance, you might benefit from a biopsy of these areas.  I did go ahead and put in referral to a dermatologist.  Hopefully we can get you in relatively quickly.  If not dermatologist and ask for might be option as they sometimes get quicker appointments recently.  For history of smoking, I recommend that you stop.  You can get chest x-ray today or on follow-up.  Follow-up within a couple weeks for complete physical exam early morning.  Want to get fasting labs so please fast for 8 hours prior to appointment.  Esperanza RichtersEdward Bartley Vuolo, PA-C

## 2018-06-16 NOTE — Patient Instructions (Signed)
For your recent rash, I want you to use Kenalog cream thin film twice daily to areas, use moisturizer twice daily such as Aveeno or Lubriderm, and use Dove moisturizing soap.  We will see how your areas respond to treatment.  But due to the number of areas and new appearance, you might benefit from a biopsy of these areas.  I did go ahead and put in referral to a dermatologist.  Hopefully we can get you in relatively quickly.  If not dermatologist and ask for might be option as they sometimes get quicker appointments recently.  For history of smoking, I recommend that you stop.  You can get chest x-ray today or on follow-up.  Follow-up within a couple weeks for complete physical exam early morning.  Want to get fasting labs so please fast for 8 hours prior to appointment.

## 2018-06-23 ENCOUNTER — Encounter: Payer: Self-pay | Admitting: Medical

## 2018-06-23 ENCOUNTER — Ambulatory Visit (INDEPENDENT_AMBULATORY_CARE_PROVIDER_SITE_OTHER): Payer: 59 | Admitting: Medical

## 2018-06-23 ENCOUNTER — Telehealth: Payer: Self-pay | Admitting: Medical

## 2018-06-23 VITALS — BP 106/81 | HR 60 | Temp 97.9°F | Resp 16 | Ht 69.0 in | Wt 120.6 lb

## 2018-06-23 DIAGNOSIS — Z0000 Encounter for general adult medical examination without abnormal findings: Secondary | ICD-10-CM | POA: Diagnosis not present

## 2018-06-23 DIAGNOSIS — Z113 Encounter for screening for infections with a predominantly sexual mode of transmission: Secondary | ICD-10-CM | POA: Diagnosis not present

## 2018-06-23 DIAGNOSIS — Z125 Encounter for screening for malignant neoplasm of prostate: Secondary | ICD-10-CM | POA: Diagnosis not present

## 2018-06-23 DIAGNOSIS — E875 Hyperkalemia: Secondary | ICD-10-CM

## 2018-06-23 DIAGNOSIS — Z1211 Encounter for screening for malignant neoplasm of colon: Secondary | ICD-10-CM

## 2018-06-23 LAB — CBC WITH DIFFERENTIAL/PLATELET
BASOS ABS: 0 10*3/uL (ref 0.0–0.1)
Basophils Relative: 0.7 % (ref 0.0–3.0)
EOS ABS: 0.1 10*3/uL (ref 0.0–0.7)
Eosinophils Relative: 1.5 % (ref 0.0–5.0)
HCT: 42.7 % (ref 39.0–52.0)
HEMOGLOBIN: 13.5 g/dL (ref 13.0–17.0)
Lymphocytes Relative: 30.9 % (ref 12.0–46.0)
Lymphs Abs: 2 10*3/uL (ref 0.7–4.0)
MCHC: 31.5 g/dL (ref 30.0–36.0)
MCV: 70.9 fl — AB (ref 78.0–100.0)
Monocytes Absolute: 0.3 10*3/uL (ref 0.1–1.0)
Monocytes Relative: 5.1 % (ref 3.0–12.0)
NEUTROS ABS: 3.9 10*3/uL (ref 1.4–7.7)
Neutrophils Relative %: 61.8 % (ref 43.0–77.0)
Platelets: 258 10*3/uL (ref 150.0–400.0)
RBC: 6.03 Mil/uL — ABNORMAL HIGH (ref 4.22–5.81)
RDW: 16.5 % — ABNORMAL HIGH (ref 11.5–15.5)
WBC: 6.4 10*3/uL (ref 4.0–10.5)

## 2018-06-23 LAB — COMPREHENSIVE METABOLIC PANEL
ALT: 12 U/L (ref 0–53)
AST: 13 U/L (ref 0–37)
Albumin: 4.1 g/dL (ref 3.5–5.2)
Alkaline Phosphatase: 47 U/L (ref 39–117)
BUN: 15 mg/dL (ref 6–23)
CO2: 26 mEq/L (ref 19–32)
Calcium: 9.4 mg/dL (ref 8.4–10.5)
Chloride: 106 mEq/L (ref 96–112)
Creatinine, Ser: 0.86 mg/dL (ref 0.40–1.50)
GFR: 118.71 mL/min (ref >60.00)
Glucose, Bld: 94 mg/dL (ref 70–99)
POTASSIUM: 5.5 meq/L — AB (ref 3.5–5.1)
Sodium: 139 mEq/L (ref 135–145)
Total Bilirubin: 0.5 mg/dL (ref 0.2–1.2)
Total Protein: 6.4 g/dL (ref 6.0–8.3)

## 2018-06-23 LAB — POC URINALSYSI DIPSTICK (AUTOMATED)
BILIRUBIN UA: NEGATIVE
Glucose, UA: NEGATIVE
KETONES UA: NEGATIVE
LEUKOCYTES UA: NEGATIVE
Nitrite, UA: NEGATIVE
PH UA: 6 (ref 5.0–8.0)
Protein, UA: NEGATIVE
RBC UA: NEGATIVE
Spec Grav, UA: 1.015 (ref 1.010–1.025)
Urobilinogen, UA: NEGATIVE E.U./dL — AB

## 2018-06-23 LAB — LIPID PANEL
CHOL/HDL RATIO: 4
Cholesterol: 225 mg/dL — ABNORMAL HIGH (ref 0–200)
HDL: 57.6 mg/dL (ref >39.00)
LDL Cholesterol: 151 mg/dL — ABNORMAL HIGH (ref 0–99)
NonHDL: 167.67
Triglycerides: 83 mg/dL (ref 0.0–149.0)
VLDL: 16.6 mg/dL (ref 0.0–40.0)

## 2018-06-23 LAB — PSA: PSA: 1.27 ng/mL (ref 0.10–4.00)

## 2018-06-23 NOTE — Telephone Encounter (Signed)
Future metabolic panel place to repeat k level.

## 2018-06-23 NOTE — Progress Notes (Signed)
Subjective:    Patient ID: Walter Richards, male    DOB: [birth year], 58 y.o.   MRN: [id number]  HPI  Pt is in for cpe. He is fasting.  Works at [employer], He does not work out/exercise other than work. Pt drinks 3-4 mountain dews a day. Pt states eating healthier recently than in past. Smoke at least 3 cigarettes a day. Pt restarted smoking  For past 3 years. Pt has stopped and started in past. Pt started smoking again when mom passed. No alcohol use.    Review of Systems  Constitutional: Negative for chills, fatigue and fever.  HENT: Negative for congestion, drooling, ear pain, hearing loss, postnasal drip, rhinorrhea, sinus pressure and sinus pain.   Respiratory: Negative for cough, chest tightness, shortness of breath and wheezing.   Cardiovascular: Negative for chest pain and palpitations.  Gastrointestinal: Negative for abdominal pain, constipation, diarrhea and nausea.  Musculoskeletal: Negative for back pain, joint swelling, neck pain and neck stiffness.  Neurological: Negative for dizziness, weakness, numbness and headaches.  Hematological: Negative for adenopathy. Does not bruise/bleed easily.  Psychiatric/Behavioral: Negative for behavioral problems, confusion, sleep disturbance and suicidal ideas. The patient is not nervous/anxious.     No past medical history on file.   Social History   Socioeconomic History  . Marital status: Single    Spouse name: Not on file  . Number of children: Not on file  . Years of education: Not on file  . Highest education level: Not on file  Occupational History  . Not on file  Social Needs  . Financial resource strain: Not on file  . Food insecurity:    Worry: Not on file    Inability: Not on file  . Transportation needs:    Medical: Not on file    Non-medical: Not on file  Tobacco Use  . Smoking status: Current Some Day Smoker    Types: Cigarettes  . Smokeless tobacco: Never Used  Substance and Sexual Activity  .  Alcohol use: No  . Drug use: No  . Sexual activity: Not on file  Lifestyle  . Physical activity:    Days per week: Not on file    Minutes per session: Not on file  . Stress: Not on file  Relationships  . Social connections:    Talks on phone: Not on file    Gets together: Not on file    Attends religious service: Not on file    Active member of club or organization: Not on file    Attends meetings of clubs or organizations: Not on file    Relationship status: Not on file  . Intimate partner violence:    Fear of current or ex partner: Not on file    Emotionally abused: Not on file    Physically abused: Not on file    Forced sexual activity: Not on file  Other Topics Concern  . Not on file  Social History Narrative  . Not on file    Past Surgical History:  Procedure Laterality Date  . ABDOMINAL SURGERY      Family History  Problem Relation Age of Onset  . Hypertension Mother   . Cancer Father     No Known Allergies  Current Outpatient Medications on File Prior to Visit  Medication Sig Dispense Refill  . prochlorperazine (COMPAZINE) 10 MG tablet Take 1 tablet (10 mg total) by mouth 2 (two) times daily as needed for nausea or vomiting (headaches). 10 tablet 0  .  triamcinolone cream (KENALOG) 0.1 % Apply 1 application topically 2 (two) times daily. 30 g 0   No current facility-administered medications on file prior to visit.     BP 106/81   Pulse 60   Temp 97.9 F (36.6 C) (Oral)   Resp 16   Ht 5\' 9"  (1.753 m)   Wt 120 lb 9.6 oz (54.7 kg)   SpO2 100%   BMI 17.81 kg/m       Objective:   Physical Exam  General Mental Status- Alert. General Appearance- Not in acute distress.   Skin General: Color- Normal Color. Moisture- Normal Moisture. Small moles on back. No worrisome features.  Neck Carotid Arteries- Normal color. Moisture- Normal Moisture. No carotid bruits. No JVD.  Chest and Lung Exam Auscultation: Breath  Sounds:-Normal.  Cardiovascular Auscultation:Rythm- Regular. Murmurs & Other Heart Sounds:Auscultation of the heart reveals- No Murmurs.  Abdomen Inspection:-Inspeection Normal. Palpation/Percussion:Note:No mass. Palpation and Percussion of the abdomen reveal- Non Tender, Non Distended + BS, no rebound or guarding.   Neurologic Cranial Nerve exam:- CN III-XII intact(No nystagmus), symmetric smile. Drift Test:- No drift. Finger to Nose:- Normal/Intact Strength:- 5/5 equal and symmetric strength both upper and lower extremities.  Genital exam- inguinal canals free from hernia. Normal testicles. Rectum- normal. Prostate smooth but enlarged. No nodules. Normal texture/feel.      Assessment & Plan:  For you wellness exam today I have ordered cbc,psa,  cmp, lipid panel, ua and hiv.  Vaccines declined today.  Recommend exercise and healthy diet.  We will let you know lab results as they come in.  Follow up date appointment will be determined after lab review.

## 2018-06-23 NOTE — Patient Instructions (Addendum)
For you wellness exam today I have ordered cbc, psa, cmp, lipid panel, ua and hiv.  Vaccines declined today.  Recommend exercise and healthy diet.  We will let you know lab results as they come in.  Follow up date appointment will be determined after lab review.    Preventive Care 40-64 Years, Male Preventive care refers to lifestyle choices and visits with your health care provider that can promote health and wellness. What does preventive care include?   A yearly physical exam. This is also called an annual well check.  Dental exams once or twice a year.  Routine eye exams. Ask your health care provider how often you should have your eyes checked.  Personal lifestyle choices, including: ? Daily care of your teeth and gums. ? Regular physical activity. ? Eating a healthy diet. ? Avoiding tobacco and drug use. ? Limiting alcohol use. ? Practicing safe sex. ? Taking low-dose aspirin every day starting at age 74. What happens during an annual well check? The services and screenings done by your health care provider during your annual well check will depend on your age, overall health, lifestyle risk factors, and family history of disease. Counseling Your health care provider may ask you questions about your:  Alcohol use.  Tobacco use.  Drug use.  Emotional well-being.  Home and relationship well-being.  Sexual activity.  Eating habits.  Work and work Statistician. Screening You may have the following tests or measurements:  Height, weight, and BMI.  Blood pressure.  Lipid and cholesterol levels. These may be checked every 5 years, or more frequently if you are over 70 years old.  Skin check.  Lung cancer screening. You may have this screening every year starting at age 26 if you have a 30-pack-year history of smoking and currently smoke or have quit within the past 15 years.  Colorectal cancer screening. All adults should have this screening starting at  age 41 and continuing until age 62. Your health care provider may recommend screening at age 107. You will have tests every 1-10 years, depending on your results and the type of screening test. People at increased risk should start screening at an earlier age. Screening tests may include: ? Guaiac-based fecal occult blood testing. ? Fecal immunochemical test (FIT). ? Stool DNA test. ? Virtual colonoscopy. ? Sigmoidoscopy. During this test, a flexible tube with a tiny camera (sigmoidoscope) is used to examine your rectum and lower colon. The sigmoidoscope is inserted through your anus into your rectum and lower colon. ? Colonoscopy. During this test, a long, thin, flexible tube with a tiny camera (colonoscope) is used to examine your entire colon and rectum.  Prostate cancer screening. Recommendations will vary depending on your family history and other risks.  Hepatitis C blood test.  Hepatitis B blood test.  Sexually transmitted disease (STD) testing.  Diabetes screening. This is done by checking your blood sugar (glucose) after you have not eaten for a while (fasting). You may have this done every 1-3 years. Discuss your test results, treatment options, and if necessary, the need for more tests with your health care provider. Vaccines Your health care provider may recommend certain vaccines, such as:  Influenza vaccine. This is recommended every year.  Tetanus, diphtheria, and acellular pertussis (Tdap, Td) vaccine. You may need a Td booster every 10 years.  Varicella vaccine. You may need this if you have not been vaccinated.  Zoster vaccine. You may need this after age 32.  Measles, mumps, and  rubella (MMR) vaccine. You may need at least one dose of MMR if you were born in 1957 or later. You may also need a second dose.  Pneumococcal 13-valent conjugate (PCV13) vaccine. You may need this if you have certain conditions and have not been vaccinated.  Pneumococcal polysaccharide  (PPSV23) vaccine. You may need one or two doses if you smoke cigarettes or if you have certain conditions.  Meningococcal vaccine. You may need this if you have certain conditions.  Hepatitis A vaccine. You may need this if you have certain conditions or if you travel or work in places where you may be exposed to hepatitis A.  Hepatitis B vaccine. You may need this if you have certain conditions or if you travel or work in places where you may be exposed to hepatitis B.  Haemophilus influenzae type b (Hib) vaccine. You may need this if you have certain risk factors. Talk to your health care provider about which screenings and vaccines you need and how often you need them. This information is not intended to replace advice given to you by your health care provider. Make sure you discuss any questions you have with your health care provider. Document Released: 06/23/2015 Document Revised: 07/17/2017 Document Reviewed: 03/28/2015 Elsevier Interactive Patient Education  2019 [employer].

## 2018-06-24 LAB — HIV ANTIBODY (ROUTINE TESTING W REFLEX): HIV: NONREACTIVE

## 2018-06-29 ENCOUNTER — Other Ambulatory Visit (INDEPENDENT_AMBULATORY_CARE_PROVIDER_SITE_OTHER): Payer: 59

## 2018-06-29 DIAGNOSIS — E875 Hyperkalemia: Secondary | ICD-10-CM | POA: Diagnosis not present

## 2018-06-29 LAB — COMPREHENSIVE METABOLIC PANEL
ALT: 11 U/L (ref 0–53)
AST: 13 U/L (ref 0–37)
Albumin: 3.9 g/dL (ref 3.5–5.2)
Alkaline Phosphatase: 51 U/L (ref 39–117)
BUN: 15 mg/dL (ref 6–23)
CO2: 28 mEq/L (ref 19–32)
Calcium: 9.6 mg/dL (ref 8.4–10.5)
Chloride: 104 mEq/L (ref 96–112)
Creatinine, Ser: 0.95 mg/dL (ref 0.40–1.50)
GFR: 99.56 mL/min (ref >60.00)
GLUCOSE: 93 mg/dL (ref 70–99)
Potassium: 5.1 mEq/L (ref 3.5–5.1)
Sodium: 139 mEq/L (ref 135–145)
Total Bilirubin: 0.4 mg/dL (ref 0.2–1.2)
Total Protein: 6.3 g/dL (ref 6.0–8.3)

## 2018-07-30 ENCOUNTER — Encounter: Payer: Self-pay | Admitting: Medical

## 2019-03-02 ENCOUNTER — Emergency Department (HOSPITAL_BASED_OUTPATIENT_CLINIC_OR_DEPARTMENT_OTHER): Payer: 59

## 2019-03-02 ENCOUNTER — Emergency Department (HOSPITAL_BASED_OUTPATIENT_CLINIC_OR_DEPARTMENT_OTHER)
Admission: EM | Admit: 2019-03-02 | Discharge: 2019-03-02 | Disposition: A | Discharge status: Home / Self Care | Payer: 59 | Attending: Emergency Medicine | Admitting: Emergency Medicine

## 2019-03-02 DIAGNOSIS — X500XXA Overexertion from strenuous movement or load, initial encounter: Secondary | ICD-10-CM | POA: Diagnosis not present

## 2019-03-02 DIAGNOSIS — F17210 Nicotine dependence, cigarettes, uncomplicated: Secondary | ICD-10-CM | POA: Insufficient documentation

## 2019-03-02 DIAGNOSIS — Y939 Activity, unspecified: Secondary | ICD-10-CM | POA: Insufficient documentation

## 2019-03-02 DIAGNOSIS — T148XXA Other injury of unspecified body region, initial encounter: Secondary | ICD-10-CM

## 2019-03-02 DIAGNOSIS — Y929 Unspecified place or not applicable: Secondary | ICD-10-CM | POA: Diagnosis not present

## 2019-03-02 DIAGNOSIS — Y999 Unspecified external cause status: Secondary | ICD-10-CM | POA: Diagnosis not present

## 2019-03-02 DIAGNOSIS — S299XXA Unspecified injury of thorax, initial encounter: Secondary | ICD-10-CM | POA: Diagnosis present

## 2019-03-02 DIAGNOSIS — Z79899 Other long term (current) drug therapy: Secondary | ICD-10-CM | POA: Insufficient documentation

## 2019-03-02 DIAGNOSIS — S239XXA Sprain of unspecified parts of thorax, initial encounter: Secondary | ICD-10-CM | POA: Insufficient documentation

## 2019-03-02 MED ORDER — METHOCARBAMOL 500 MG PO TABS: 500.0000 mg | ORAL_TABLET | Freq: Two times a day (BID) | ORAL | 0 refills | Status: AC

## 2019-03-02 NOTE — ED Triage Notes (Addendum)
Presents with right Flank pain that began 5 days ago. Pain is worse with movement of right shoulder and coughing. The pain is in the right side beneath the axilla and goes around to the shoulder blade. Denies fevers. Pain is only presents with movement and cough.

## 2019-03-02 NOTE — ED Notes (Signed)
Pt states the pain was a sudden onset while he was moving a heavy object.

## 2019-03-02 NOTE — Discharge Instructions (Addendum)
You can take Tylenol or Ibuprofen as directed for pain. You can alternate Tylenol and Ibuprofen every 4 hours. If you take Tylenol at 1pm, then you can take Ibuprofen at 5pm. Then you can take Tylenol again at 9pm.   Take Robaxin as prescribed. This medication will make you drowsy so do not drive or drink alcohol when taking it.  Follow-up with your primary care doctor in 2 to 4 days.  Return the emergency department any worsening pain, difficulty breathing, fevers or any other worsening concerning symptoms.

## 2019-03-02 NOTE — ED Provider Notes (Signed)
[facility] EMERGENCY DEPARTMENT Provider Note   CSN: [id number] Arrival date & time: 03/02/19  1528     History   Chief Complaint Chief Complaint  Patient presents with   Flank Pain    HPI Walter Richards is a 58 y.o. male who presents for evaluation of 5 days of right sided lateral chest wall and back pain.  He reports that prior to onset of symptoms, he was moving heavy totes at his work.  He states that the pain happened acutely while he was moving these.  He states that since then, he has had pain that starts in his lateral chest wall and radiates to his back.  He states that this pain is worse with palpation of the area, movement, twisting, bending.  He states that he has not noticed any overlying warmth, erythema, rash.  He states that he will occasionally take some Tylenol but states it has not been helping so he stopped.  He has not had any associated nausea/vomiting, difficulty breathing.  He states that the pain is not worse with deep inspiration.  He denies any fall.  Patient denies any fevers. He denies any exogenous hormone use, recent immobilization, prior history of DVT/PE, recent surgery, leg swelling, or long travel.    The history is provided by the patient.    No past medical history on file.  There are no active problems to display for this patient.   Past Surgical History:  Procedure Laterality Date   ABDOMINAL SURGERY          Home Medications    Prior to Admission medications   Medication Sig Start Date End Date Taking? Authorizing Provider  methocarbamol (ROBAXIN) 500 MG tablet Take 1 tablet (500 mg total) by mouth 2 (two) times daily. 03/02/19   Maxwell Caul, PA-C  prochlorperazine (COMPAZINE) 10 MG tablet Take 1 tablet (10 mg total) by mouth 2 (two) times daily as needed for nausea or vomiting (headaches). 09/09/15   Alvira Monday, MD  triamcinolone cream (KENALOG) 0.1 % Apply 1 application topically 2 (two) times daily. 06/16/18    Saguier, Ramon Dredge, PA-C    Family History Family History  Problem Relation Age of Onset   Hypertension Mother    Cancer Father     Social History Social History   Tobacco Use   Smoking status: Current Some Day Smoker    Types: Cigarettes   Smokeless tobacco: Never Used  Substance Use Topics   Alcohol use: No   Drug use: No     Allergies   Patient has no known allergies.   Review of Systems Review of Systems  Constitutional: Negative for fever.  Respiratory: Negative for shortness of breath.   Musculoskeletal: Positive for back pain.       Right lateral chest wall pain  All other systems reviewed and are negative.    Physical Exam Updated Vital Signs BP (!) 119/95 (BP Location: Right Arm)    Pulse 68    Temp 99.3 F (37.4 C) (Oral)    Resp 14    Ht 5\' 9"  (1.753 m)    Wt 63.5 kg    SpO2 100%    BMI 20.67 kg/m   Physical Exam Vitals signs and nursing note reviewed.  Constitutional:      Appearance: He is well-developed.  HENT:     Head: Normocephalic and atraumatic.  Eyes:     General: No scleral icterus.       Right eye: No discharge.  Left eye: No discharge.     Conjunctiva/sclera: Conjunctivae normal.  Cardiovascular:     Pulses:          Radial pulses are 2+ on the right side and 2+ on the left side.  Pulmonary:     Effort: Pulmonary effort is normal.     Comments: Lungs clear to auscultation bilaterally.  Symmetric chest rise.  No wheezing, rales, rhonchi. Chest:       Comments: Tenderness palpation noted to right anterior chest wall that is reproducible on palpation. Musculoskeletal:     Thoracic back: He exhibits no tenderness.     Lumbar back: He exhibits no tenderness.       Back:     Comments: Tenderness palpation noted to paraspinal muscle of the right thoracic region.  No deformity or crepitus noted.  No overlying warmth, erythema.  No midline bony tenderness noted to T or L-spine.  Skin:    General: Skin is warm and dry.    Neurological:     Mental Status: He is alert.  Psychiatric:        Speech: Speech normal.        Behavior: Behavior normal.      ED Treatments / Results  Labs (all labs ordered are listed, but only abnormal results are displayed) Labs Reviewed - No data to display  EKG None  Radiology Dg Ribs Unilateral W/chest Right  Result Date: 03/02/2019 CLINICAL DATA:  Pain after moving heavy object EXAM: RIGHT RIBS AND CHEST - 3+ VIEW COMPARISON:  Chest radiographs June 16, 2018 FINDINGS: Frontal chest as well as oblique and cone-down rib images were obtained. There is stable scarring and asymmetric pleural thickening in the right apex. There is no edema or consolidation. Heart size and pulmonary vascularity are normal. No adenopathy. No evident pneumothorax or pleural effusion. No demonstrable rib fracture. IMPRESSION: No evident rib fracture. No pneumothorax. Stable scarring and asymmetric pleural thickening on the right. No edema or consolidation. Electronically Signed   By: Lowella Grip III M.D.   On: 03/02/2019 17:50    Procedures Procedures (including critical care time)  Medications Ordered in ED Medications - No data to display   Initial Impression / Assessment and Plan / ED Course  I have reviewed the triage vital signs and the nursing notes.  Pertinent labs & imaging results that were available during my care of the patient were reviewed by me and considered in my medical decision making (see chart for details).        58 year old male who presents for evaluation of pain to right lateral chest wall and back that began 5 days ago.  Does endorse heavy lifting prior to onset of pain.  No fall, trauma.  No difficulty breathing.  No fevers.  No rash. Patient is afebrile, non-toxic appearing, sitting comfortably on examination table. Vital signs reviewed and stable.  Pain is worse with movement, twisting and palpation of the area.  No difficulty breathing.  I doubt ACS  etiology is a sounds very atypical. The pain is reproduced with palpation and with movement and it sounds very musculoskeletal in nature.  Additionally, patient does not have any risk factors for PE.  He is not tachycardic or hypoxic here in the ED.  History/physical exam not concerning for aortic dissection.  Suspect this most likely musculoskeletal in nature.  Chest x-ray reviewed.  No evidence of rib fracture.  No evidence of pneumothorax.  There is some stable scarring and asymmetric pleural thickening on  the right.  Otherwise no alarm abnormalities.  Discussed results with patient.  Suspect this most likely musculoskeletal given that it is tender palpation and worse with muscular movement.  We will plan to treat with muscle relaxers. At this time, patient exhibits no emergent life-threatening condition that require further evaluation in ED or admission. Patient had ample opportunity for questions and discussion. All patient's questions were answered with full understanding. Strict return precautions discussed. Patient expresses understanding and agreement to plan.   Portions of this note were generated with Scientist, clinical (histocompatibility and immunogenetics). Dictation errors may occur despite best attempts at proofreading.   Final Clinical Impressions(s) / ED Diagnoses   Final diagnoses:  Muscle strain    ED Discharge Orders         Ordered    methocarbamol (ROBAXIN) 500 MG tablet  2 times daily     03/02/19 1836           Maxwell Caul, PA-C 03/02/19 2234    Vanetta Mulders, MD 03/08/19 [phone]

## 2019-03-24 ENCOUNTER — Emergency Department (HOSPITAL_BASED_OUTPATIENT_CLINIC_OR_DEPARTMENT_OTHER)
Admission: EM | Admit: 2019-03-24 | Discharge: 2019-03-24 | Disposition: A | Discharge status: Home / Self Care | Payer: 59 | Attending: Emergency Medicine | Admitting: Emergency Medicine

## 2019-03-24 ENCOUNTER — Encounter (HOSPITAL_BASED_OUTPATIENT_CLINIC_OR_DEPARTMENT_OTHER): Payer: Self-pay

## 2019-03-24 ENCOUNTER — Emergency Department (HOSPITAL_BASED_OUTPATIENT_CLINIC_OR_DEPARTMENT_OTHER): Payer: 59

## 2019-03-24 ENCOUNTER — Other Ambulatory Visit: Payer: Self-pay

## 2019-03-24 DIAGNOSIS — Z79899 Other long term (current) drug therapy: Secondary | ICD-10-CM | POA: Insufficient documentation

## 2019-03-24 DIAGNOSIS — F17210 Nicotine dependence, cigarettes, uncomplicated: Secondary | ICD-10-CM | POA: Diagnosis not present

## 2019-03-24 DIAGNOSIS — R519 Headache, unspecified: Secondary | ICD-10-CM | POA: Diagnosis present

## 2019-03-24 LAB — CBC
HCT: 38.2 % — ABNORMAL LOW (ref 39.0–52.0)
Hemoglobin: 11.8 g/dL — ABNORMAL LOW (ref 13.0–17.0)
MCH: 21.9 pg — ABNORMAL LOW (ref 26.0–34.0)
MCHC: 30.9 g/dL (ref 30.0–36.0)
MCV: 71 fL — ABNORMAL LOW (ref 80.0–100.0)
Platelets: 311 10*3/uL (ref 150–400)
RBC: 5.38 MIL/uL (ref 4.22–5.81)
RDW: 15.8 % — ABNORMAL HIGH (ref 11.5–15.5)
WBC: 9.8 10*3/uL (ref 4.0–10.5)
nRBC: 0 % (ref 0.0–0.2)

## 2019-03-24 LAB — BASIC METABOLIC PANEL
Anion gap: 8 (ref 5–15)
BUN: 14 mg/dL (ref 6–20)
CO2: 24 mmol/L (ref 22–32)
Calcium: 8.6 mg/dL — ABNORMAL LOW (ref 8.9–10.3)
Chloride: 104 mmol/L (ref 98–111)
Creatinine, Ser: 0.71 mg/dL (ref 0.61–1.24)
GFR calc Af Amer: >60 mL/min (ref >60)
GFR calc non Af Amer: >60 mL/min (ref >60)
Glucose, Bld: 91 mg/dL (ref 70–99)
Potassium: 3.7 mmol/L (ref 3.5–5.1)
Sodium: 136 mmol/L (ref 135–145)

## 2019-03-24 MED ORDER — KETOROLAC TROMETHAMINE 15 MG/ML IJ SOLN
15.0000 mg | Freq: Once | INTRAMUSCULAR | Status: AC
  Administered 2019-03-24: 15:00:00 15 mg via INTRAVENOUS
  Filled 2019-03-24: qty 1

## 2019-03-24 NOTE — ED Triage Notes (Signed)
Pt c/o HA x 2 weeks-denies fever/flu like sx-denies injury-NAD-steady gait

## 2019-03-24 NOTE — ED Provider Notes (Signed)
[facility] EMERGENCY DEPARTMENT Provider Note   CSN: [id number] Arrival date & time: 03/24/19  1258     History   Chief Complaint Chief Complaint  Patient presents with  . Headache    HPI Walter Richards is a 58 y.o. male.     Patient presents the emergency department with complaint of frontal headache, ongoing every day for the past 2 weeks.  Patient also states that he has been very fatigued.  Patient gets an occasional headache but no headaches like this.  Typically they resolved with ibuprofen or Tylenol.  Patient has been taking his medications without improvement currently.  He is also taken a muscle relaxant.  No vision changes, weakness in the arms of the legs.  No fevers or confusion.  No neck pain.  He denies any injury to the head at the onset.  He left work today because his headache was making it difficult for him to focus.  Gradual onset.  Course is constant.     History reviewed. No pertinent past medical history.  There are no active problems to display for this patient.   Past Surgical History:  Procedure Laterality Date  . ABDOMINAL SURGERY          Home Medications    Prior to Admission medications   Medication Sig Start Date End Date Taking? Authorizing Provider  methocarbamol (ROBAXIN) 500 MG tablet Take 1 tablet (500 mg total) by mouth 2 (two) times daily. 03/02/19   Volanda Napoleon, PA-C  prochlorperazine (COMPAZINE) 10 MG tablet Take 1 tablet (10 mg total) by mouth 2 (two) times daily as needed for nausea or vomiting (headaches). 09/09/15   Gareth Morgan, MD  triamcinolone cream (KENALOG) 0.1 % Apply 1 application topically 2 (two) times daily. 06/16/18   Saguier, Percell Miller, PA-C    Family History Family History  Problem Relation Age of Onset  . Hypertension Mother   . Cancer Father     Social History Social History   Tobacco Use  . Smoking status: Current Some Day Smoker    Types: Cigarettes  . Smokeless tobacco: Never Used   Substance Use Topics  . Alcohol use: No    Frequency: Never  . Drug use: No     Allergies   Patient has no known allergies.   Review of Systems Review of Systems  Constitutional: Positive for fatigue. Negative for fever.  HENT: Negative for congestion, dental problem, rhinorrhea and sinus pressure.   Eyes: Negative for photophobia, discharge, redness and visual disturbance.  Respiratory: Negative for shortness of breath.   Cardiovascular: Negative for chest pain.  Gastrointestinal: Negative for nausea and vomiting.  Musculoskeletal: Negative for gait problem, neck pain and neck stiffness.  Skin: Negative for rash.  Neurological: Positive for headaches. Negative for syncope, speech difficulty, weakness, light-headedness and numbness.  Psychiatric/Behavioral: Negative for confusion.     Physical Exam Updated Vital Signs BP 109/76 (BP Location: Left Arm)   Pulse 69   Temp 98.8 F (37.1 C) (Oral)   Resp 16   Ht 5\' 9"  (1.753 m)   Wt 54.9 kg   SpO2 100%   BMI 17.87 kg/m   Physical Exam Vitals signs and nursing note reviewed.  Constitutional:      Appearance: He is well-developed.  HENT:     Head: Normocephalic and atraumatic.     Right Ear: Tympanic membrane, ear canal and external ear normal.     Left Ear: Tympanic membrane, ear canal and external ear normal.  Nose: Nose normal.     Mouth/Throat:     Pharynx: Uvula midline.  Eyes:     General: Lids are normal.     Conjunctiva/sclera: Conjunctivae normal.     Pupils: Pupils are equal, round, and reactive to light.  Neck:     Musculoskeletal: Normal range of motion and neck supple.  Cardiovascular:     Rate and Rhythm: Normal rate and regular rhythm.  Pulmonary:     Effort: Pulmonary effort is normal.     Breath sounds: Normal breath sounds.  Abdominal:     Palpations: Abdomen is soft.     Tenderness: There is no abdominal tenderness.  Musculoskeletal: Normal range of motion.     Cervical back: He  exhibits normal range of motion, no tenderness and no bony tenderness.  Skin:    General: Skin is warm and dry.  Neurological:     Mental Status: He is alert and oriented to person, place, and time.     GCS: GCS eye subscore is 4. GCS verbal subscore is 5. GCS motor subscore is 6.     Cranial Nerves: No cranial nerve deficit.     Sensory: No sensory deficit.     Motor: No abnormal muscle tone.     Coordination: Coordination normal.     Gait: Gait normal.     Deep Tendon Reflexes: Reflexes are normal and symmetric.      ED Treatments / Results  Labs (all labs ordered are listed, but only abnormal results are displayed) Labs Reviewed  CBC - Abnormal; Notable for the following components:      Result Value   Hemoglobin 11.8 (*)    HCT 38.2 (*)    MCV 71.0 (*)    MCH 21.9 (*)    RDW 15.8 (*)    All other components within normal limits  BASIC METABOLIC PANEL - Abnormal; Notable for the following components:   Calcium 8.6 (*)    All other components within normal limits    EKG None  Radiology Ct Head Wo Contrast  Result Date: 03/24/2019 CLINICAL DATA:  Two weeks of frontal headache. EXAM: CT HEAD WITHOUT CONTRAST TECHNIQUE: Contiguous axial images were obtained from the base of the skull through the vertex without intravenous contrast. COMPARISON:  09/09/2015 FINDINGS: Brain: No evidence of acute infarction, hemorrhage, hydrocephalus, extra-axial collection or mass lesion/mass effect. Minor periventricular white matter hypoattenuation is noted consistent with chronic microvascular ischemic change. Vascular: No hyperdense vessel or unexpected calcification. Skull: Normal. Negative for fracture or focal lesion. Sinuses/Orbits: Visualized globes and orbits are unremarkable. The visualized sinuses and mastoid air cells are clear. Other: None. IMPRESSION: No acute intracranial abnormalities. Electronically Signed   By: Amie Portlandavid  Ormond M.D.   On: 03/24/2019 14:14    Procedures  Procedures (including critical care time)  Medications Ordered in ED Medications  ketorolac (TORADOL) 15 MG/ML injection 15 mg (15 mg Intravenous Given 03/24/19 1448)     Initial Impression / Assessment and Plan / ED Course  I have reviewed the triage vital signs and the nursing notes.  Pertinent labs & imaging results that were available during my care of the patient were reviewed by me and considered in my medical decision making (see chart for details).        Patient seen and examined.  Will check CBC, BMP, and obtain head CT given new type of headache, persistent, age greater than 50.  Patient in agreement.  Vital signs reviewed and are as follows: BP  109/76 (BP Location: Left Arm)   Pulse 69   Temp 98.8 F (37.1 C) (Oral)   Resp 16   Ht 5\' 9"  (1.753 m)   Wt 54.9 kg   SpO2 100%   BMI 17.87 kg/m   3:33 PM patient is doing well while in the emergency department.  He states he feels little drowsy after getting Toradol otherwise well.  We reviewed results of blood test and CT results.  No indications for further work-up at this time.  Patient is in agreement and will be discharged home to rest.  Encourage PCP follow-up in the next week for recheck.  Patient encouraged to return with worsening pain, uncontrolled pain, any neurologic deficits, vomiting, fever, or other concerns.   Final Clinical Impressions(s) / ED Diagnoses   Final diagnoses:  Acute nonintractable headache, unspecified headache type   HA: Patient without high-risk features of headache including: sudden onset/thunderclap HA, altered mental status, accompanying seizure, headache with exertion, history of immunocompromise, neck or shoulder pain, fever, use of anticoagulation, family history of spontaneous SAH, concomitant drug use, toxic exposure.   Patient has a normal complete neurological exam, normal vital signs, normal level of consciousness, no signs of meningismus, is well-appearing/non-toxic  appearing, no signs of trauma.   CT imaging performed given atypical headache for patient.   No dangerous or life-threatening conditions suspected or identified by history, physical exam, and by work-up. No indications for hospitalization identified.   Fatigue: Very slightly low red blood cell count, do not suspect that this is entirely the cause of his fatigue.  ED Discharge Orders    None       , Renne Crigler 03/24/19 1536    03/26/19, MD 04/03/19 1531

## 2019-03-24 NOTE — Discharge Instructions (Signed)
Please read and follow all provided instructions.  Your diagnoses today include:  1. Acute nonintractable headache, unspecified headache type     Tests performed today include:  CT of your head which was normal and did not show any serious cause of your headache  Blood counts and electrolytes -slightly low red blood cells otherwise no concerning findings  Vital signs. See below for your results today.   Medications:  In the Emergency Department you received:  Toradol - NSAID medication similar to ibuprofen  Take any prescribed medications only as directed.  Additional information:  Follow any educational materials contained in this packet.  You are having a headache. No specific cause was found today for your headache. It may have been a migraine or other cause of headache. Stress, anxiety, fatigue, and depression are common triggers for headaches.   Your headache today does not appear to be life-threatening or require hospitalization, but often the exact cause of headaches is not determined in the emergency department. Therefore, follow-up with your doctor is very important to find out what may have caused your headache and whether or not you need any further diagnostic testing or treatment.   Sometimes headaches can appear benign (not harmful), but then more serious symptoms can develop which should prompt an immediate re-evaluation by your doctor or the emergency department.  BE VERY CAREFUL not to take multiple medicines containing Tylenol (also called acetaminophen). Doing so can lead to an overdose which can damage your liver and cause liver failure and possibly death.   Follow-up instructions: Please follow-up with your primary care provider in the next 3 days for further evaluation of your symptoms.   Return instructions:   Please return to the Emergency Department if you experience worsening symptoms.  Return if the medications do not resolve your headache, if it  recurs, or if you have multiple episodes of vomiting or cannot keep down fluids.  Return if you have a change from the usual headache.  RETURN IMMEDIATELY IF you:  Develop a sudden, severe headache  Develop confusion or become poorly responsive or faint  Develop a fever above 100.48F or problem breathing  Have a change in speech, vision, swallowing, or understanding  Develop new weakness, numbness, tingling, incoordination in your arms or legs  Have a seizure  Please return if you have any other emergent concerns.  Additional Information:  Your vital signs today were: BP 109/76 (BP Location: Left Arm)    Pulse 69    Temp 98.8 F (37.1 C) (Oral)    Resp 16    Ht 5\' 9"  (1.753 m)    Wt 54.9 kg    SpO2 100%    BMI 17.87 kg/m  If your blood pressure (BP) was elevated above 135/85 this visit, please have this repeated by your doctor within one month. --------------

## 2020-08-08 ENCOUNTER — Encounter (HOSPITAL_BASED_OUTPATIENT_CLINIC_OR_DEPARTMENT_OTHER): Payer: Self-pay | Admitting: Emergency Medicine

## 2020-08-08 ENCOUNTER — Emergency Department (HOSPITAL_BASED_OUTPATIENT_CLINIC_OR_DEPARTMENT_OTHER): Payer: 59

## 2020-08-08 ENCOUNTER — Other Ambulatory Visit: Payer: Self-pay

## 2020-08-08 ENCOUNTER — Emergency Department (HOSPITAL_BASED_OUTPATIENT_CLINIC_OR_DEPARTMENT_OTHER)
Admission: EM | Admit: 2020-08-08 | Discharge: 2020-08-08 | Disposition: A | Discharge status: Home / Self Care | Payer: 59 | Attending: Emergency Medicine | Admitting: Emergency Medicine

## 2020-08-08 DIAGNOSIS — M79661 Pain in right lower leg: Secondary | ICD-10-CM | POA: Diagnosis present

## 2020-08-08 DIAGNOSIS — F17210 Nicotine dependence, cigarettes, uncomplicated: Secondary | ICD-10-CM | POA: Diagnosis not present

## 2020-08-08 MED ORDER — DICLOFENAC SODIUM 1 % EX GEL: 2.0000 g | Freq: Four times a day (QID) | CUTANEOUS | 0 refills | Status: AC | PRN

## 2020-08-08 MED ORDER — IBUPROFEN 800 MG PO TABS: 800.0000 mg | ORAL_TABLET | Freq: Three times a day (TID) | ORAL | 0 refills | Status: AC

## 2020-08-08 NOTE — ED Triage Notes (Signed)
Reports he bent down to pick up something this morning.  Noticed pain in right leg after standing.  Having continued pain.  Reports no relief with ibuprofen.  Last dose this morning.

## 2020-08-08 NOTE — ED Notes (Signed)
Pt is currently in radiology. 

## 2020-08-08 NOTE — ED Provider Notes (Signed)
Emergency Department Provider Note   I have reviewed the triage vital signs and the nursing notes.   HISTORY  Chief Complaint Leg Pain   HPI Walter Richards is a 60 y.o. male with past history reviewed below presents to the emergency department with right calf pain which began suddenly today.  Patient was bending down doing some work and when he stood up had pain in the right calf.  Pain is moderate and worse with walking.  He has not had leg swelling.  No fevers or chills.  No chest pain or shortness of breath.  No pain in the knee.  He has not noticed any knee swelling or redness.  History reviewed. No pertinent past medical history.  There are no problems to display for this patient.   Past Surgical History:  Procedure Laterality Date  . ABDOMINAL SURGERY      Allergies Patient has no known allergies.  Family History  Problem Relation Age of Onset  . Hypertension Mother   . Cancer Father     Social History Social History   Tobacco Use  . Smoking status: Current Some Day Smoker    Types: Cigarettes  . Smokeless tobacco: Never Used  Vaping Use  . Vaping Use: Never used  Substance Use Topics  . Alcohol use: No  . Drug use: No    Review of Systems  Constitutional: No fever/chills Musculoskeletal: Negative for back pain. Positive right calf pain.  Skin: Negative for rash. Neurological: Negative for headaches, focal weakness or numbness.  ____________________________________________   PHYSICAL EXAM:  VITAL SIGNS: ED Triage Vitals  Enc Vitals Group     BP 08/08/20 1659 124/79     Pulse Rate 08/08/20 1659 66     Resp 08/08/20 1659 20     Temp 08/08/20 1659 98.3 F (36.8 C)     Temp Source 08/08/20 1659 Oral     SpO2 08/08/20 1659 100 %     Weight 08/08/20 1723 121 lb 0.5 oz (54.9 kg)     Height 08/08/20 1723 5\' 9"  (1.753 m)   Constitutional: Alert and oriented. Well appearing and in no acute distress. Eyes: Conjunctivae are normal.  Head:  Atraumatic. Nose: No congestion/rhinnorhea. Mouth/Throat: Mucous membranes are moist.   Neck: No stridor.   Cardiovascular: Good peripheral circulation.  Respiratory: Normal respiratory effort.  Gastrointestinal: No distention.  Musculoskeletal: Tenderness to the right posterior calf without overlying skin changes, masses, rash.  No appreciable knee swelling.  Normal range of motion of the right knee and ankle.  Neurologic:  Normal speech and language.  Skin:  Skin is warm, dry and intact. No rash noted.  ____________________________________________  RADIOLOGY  DG Knee Complete 4 Views Right  Result Date: 08/08/2020 CLINICAL DATA:  Right knee pain. EXAM: RIGHT KNEE - COMPLETE 4+ VIEW COMPARISON:  None. FINDINGS: No evidence of acute fracture or dislocation. No evidence of arthropathy or other focal bone abnormality. A small to moderate sized joint effusion is noted. IMPRESSION: 1. No acute osseous abnormality. 2. Small to moderate sized joint effusion. Electronically Signed   By: 10/08/2020 M.D.   On: 08/08/2020 17:56    ____________________________________________   PROCEDURES  Procedure(s) performed:   Procedures  None  ____________________________________________   INITIAL IMPRESSION / ASSESSMENT AND PLAN / ED COURSE  Pertinent labs & imaging results that were available during my care of the patient were reviewed by me and considered in my medical decision making (see chart for details).   Patient  presents to the emergency department with right calf pain after standing suddenly.  Likely muscle strain.  The Achilles tendon is nontender and has normal plantar flexion and dorsiflexion.  Given this, plan for further evaluation of DVT with ultrasound.  Plain films reviewed showing no osseous abnormality. Effusion noted on plain film but exam not consistent with septic joint.    ____________________________________________  FINAL CLINICAL IMPRESSION(S) / ED  DIAGNOSES  Final diagnoses:  Right calf pain    NEW OUTPATIENT MEDICATIONS STARTED DURING THIS VISIT:  Discharge Medication List as of 08/08/2020  7:55 PM    START taking these medications   Details  diclofenac Sodium (VOLTAREN) 1 % GEL Apply 2 g topically 4 (four) times daily as needed., Starting Tue 08/08/2020, Normal    ibuprofen (ADVIL) 800 MG tablet Take 1 tablet (800 mg total) by mouth 3 (three) times daily., Starting Tue 08/08/2020, Normal        Note:  This document was prepared using Dragon voice recognition software and may include unintentional dictation errors.  Alona Bene, MD, [facility]    Aristidis Talerico, Arlyss Repress, MD 08/17/20 [phone]

## 2020-08-08 NOTE — Discharge Instructions (Signed)

## 2021-04-24 IMAGING — DX DG KNEE COMPLETE 4+V*R*
4 series · 4 of 4 positions shown · non-contrast
Comparison: None.

CLINICAL DATA: Right knee pain.

EXAM:
RIGHT KNEE - COMPLETE 4+ VIEW

[knee ap]
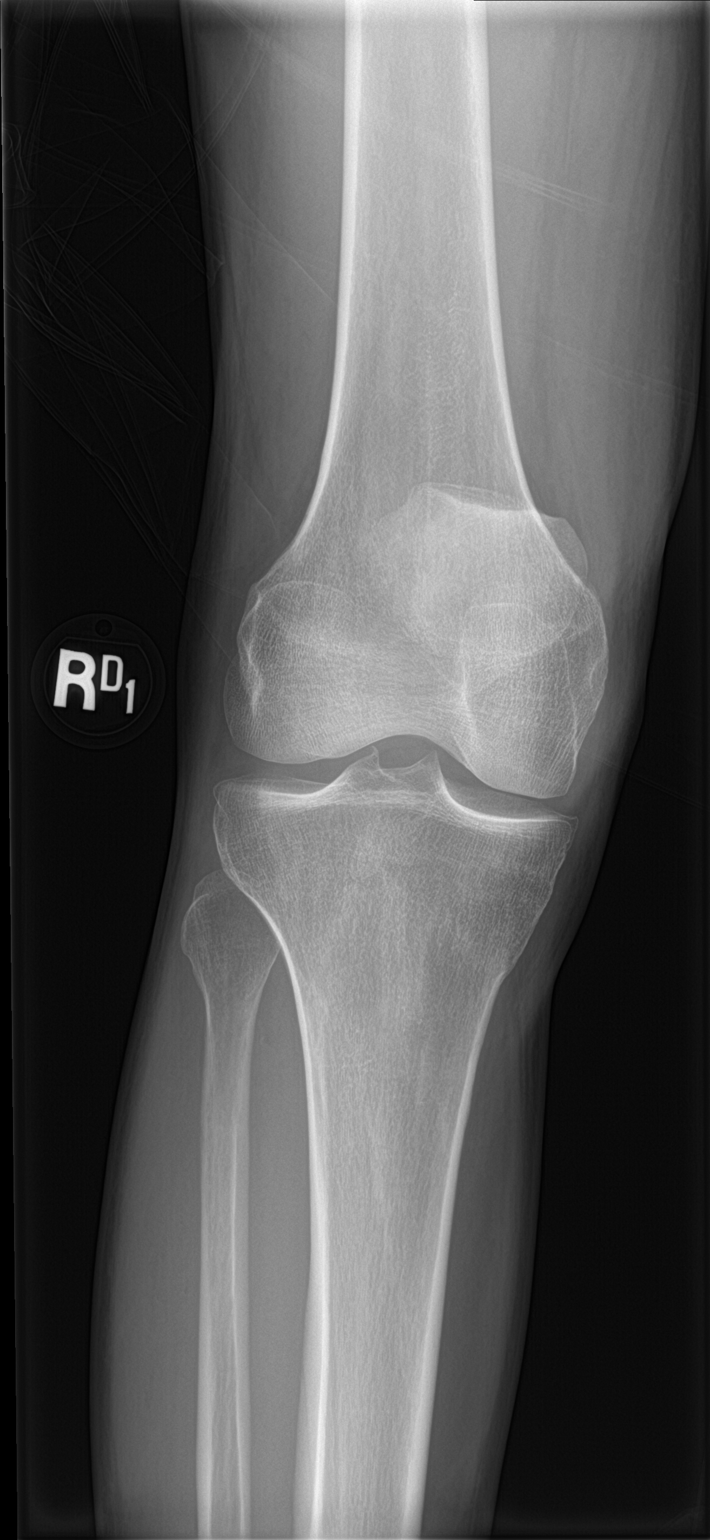

[knee lat]
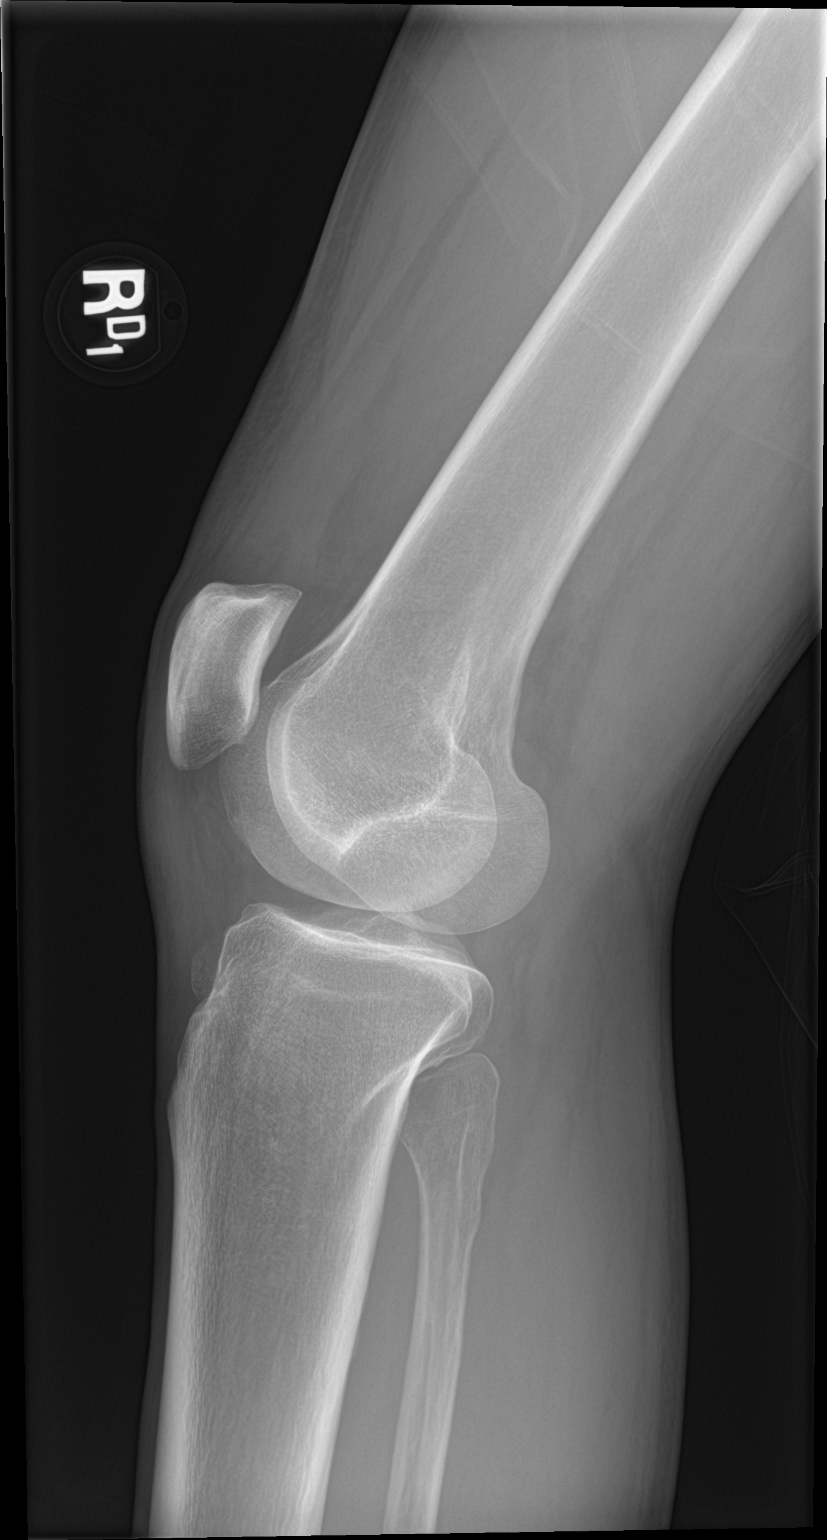

[knee obl (1 of 2)]
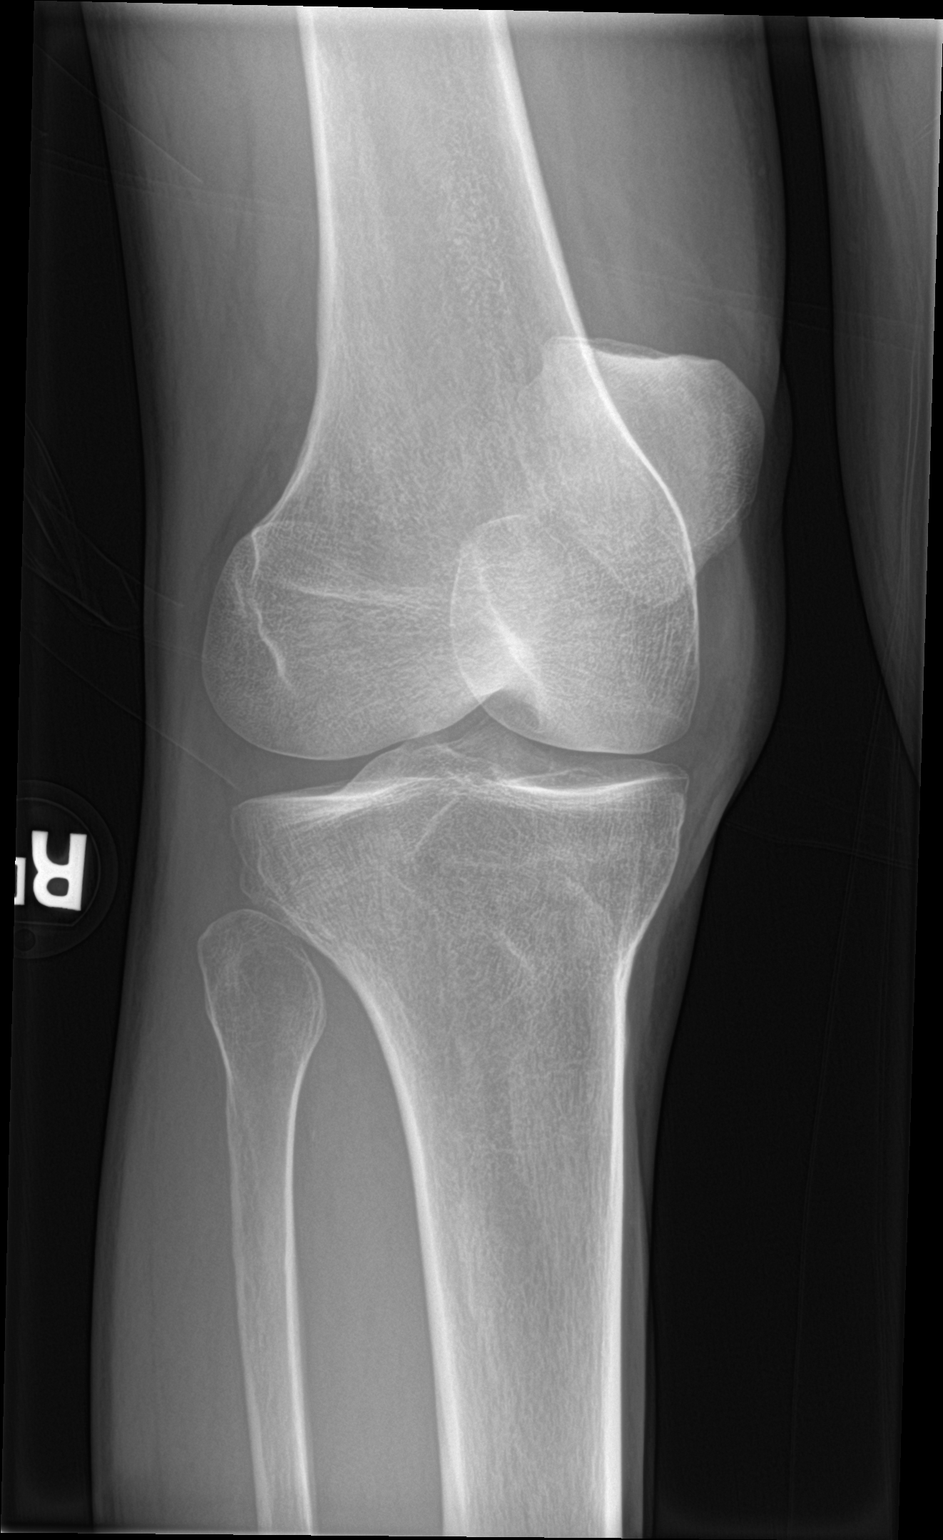

[knee obl (2 of 2)]
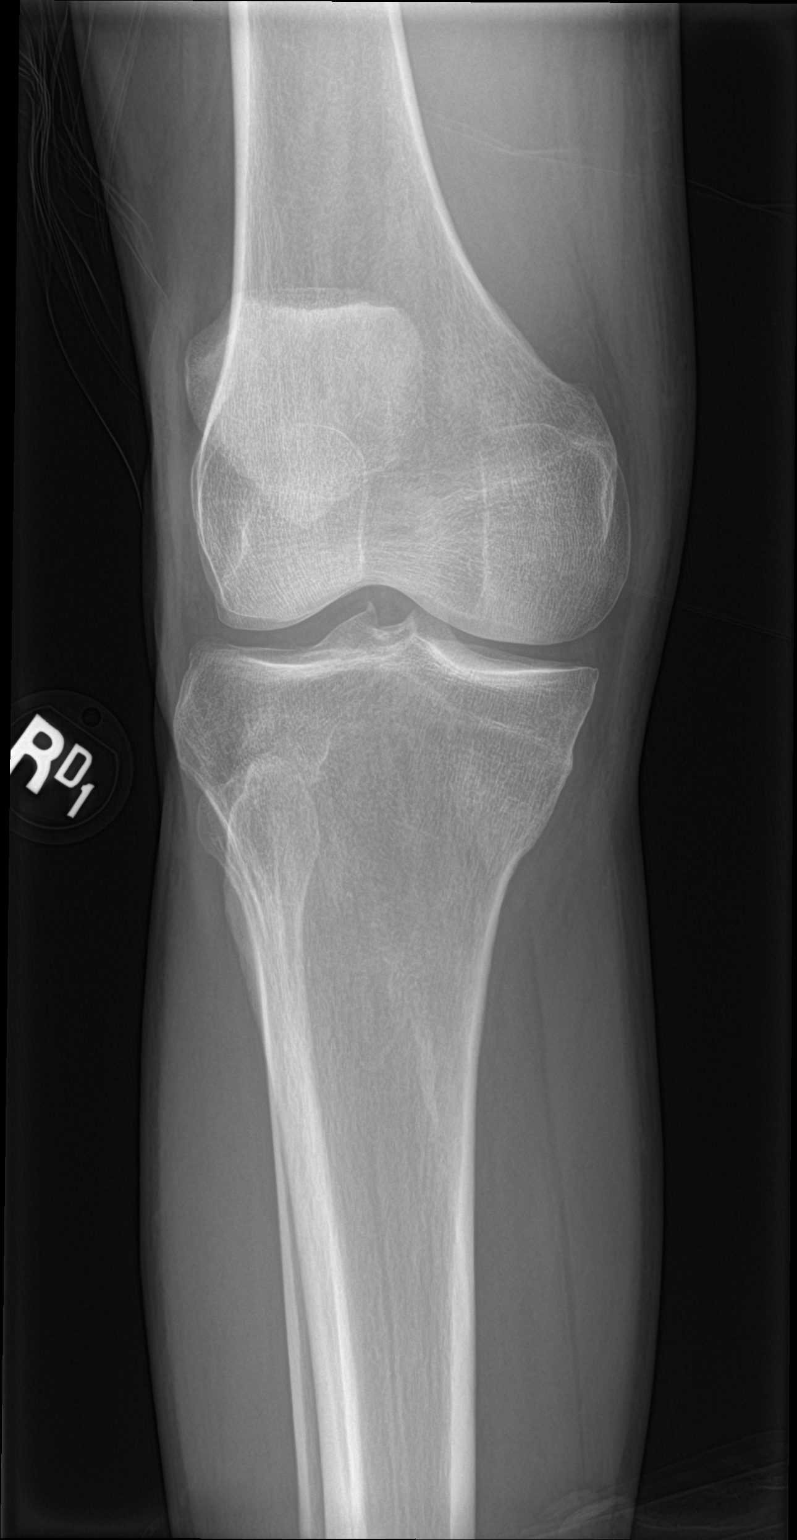

[4 of 4 positions shown; findings below may reference images not displayed]

FINDINGS: No evidence of acute fracture or dislocation. No evidence of
arthropathy or other focal bone abnormality. A small to moderate
sized joint effusion is noted.
IMPRESSION: 1. No acute osseous abnormality.
2. Small to moderate sized joint effusion.

## 2024-04-22 ENCOUNTER — Emergency Department (HOSPITAL_BASED_OUTPATIENT_CLINIC_OR_DEPARTMENT_OTHER)
Admission: EM | Admit: 2024-04-22 | Discharge: 2024-04-22 | Disposition: A | Discharge status: Home / Self Care | Payer: Self-pay | Attending: Emergency Medicine | Admitting: Emergency Medicine

## 2024-04-22 ENCOUNTER — Emergency Department (HOSPITAL_BASED_OUTPATIENT_CLINIC_OR_DEPARTMENT_OTHER): Payer: Self-pay

## 2024-04-22 ENCOUNTER — Encounter (HOSPITAL_BASED_OUTPATIENT_CLINIC_OR_DEPARTMENT_OTHER): Payer: Self-pay | Admitting: Emergency Medicine

## 2024-04-22 ENCOUNTER — Other Ambulatory Visit: Payer: Self-pay

## 2024-04-22 DIAGNOSIS — M25512 Pain in left shoulder: Secondary | ICD-10-CM | POA: Insufficient documentation

## 2024-04-22 LAB — CBC
HCT: 40.8 % (ref 39.0–52.0)
Hemoglobin: 12.5 g/dL — ABNORMAL LOW (ref 13.0–17.0)
MCH: 21.8 pg — ABNORMAL LOW (ref 26.0–34.0)
MCHC: 30.6 g/dL (ref 30.0–36.0)
MCV: 71.2 fL — ABNORMAL LOW (ref 80.0–100.0)
Platelets: 225 K/uL (ref 150–400)
RBC: 5.73 MIL/uL (ref 4.22–5.81)
RDW: 17.1 % — ABNORMAL HIGH (ref 11.5–15.5)
WBC: 6.7 K/uL (ref 4.0–10.5)
nRBC: 0 % (ref 0.0–0.2)

## 2024-04-22 LAB — BASIC METABOLIC PANEL WITH GFR
Anion gap: 10 (ref 5–15)
BUN: 13 mg/dL (ref 6–20)
CO2: 26 mmol/L (ref 22–32)
Calcium: 9.3 mg/dL (ref 8.9–10.3)
Chloride: 106 mmol/L (ref 98–111)
Creatinine, Ser: 0.76 mg/dL (ref 0.61–1.24)
GFR, Estimated: >60 mL/min (ref >60)
Glucose, Bld: 90 mg/dL (ref 70–99)
Potassium: 4.1 mmol/L (ref 3.5–5.1)
Sodium: 142 mmol/L (ref 135–145)

## 2024-04-22 LAB — TROPONIN T, HIGH SENSITIVITY: Troponin T High Sensitivity: 18 ng/L (ref 0–19)

## 2024-04-22 NOTE — ED Provider Notes (Signed)
 [place] [facility] Provider Note   CSN: [id number] Arrival date & time: 04/22/24  1612     Patient presents with: Shoulder Pain   Walter Richards is a 63 y.o. male.   Patient is here for evaluation of left shoulder pain.  Patient fell from a 10 foot ladder at work 3 weeks ago.  He was not evaluated at that time as he was not having any issues.  He is here today complaining of left shoulder pain that extends into his armpit whenever he lifts the left arm.  He has also been experiencing soreness of the left upper arm and is unable to lay on his left side to sleep due to pain.  He has been sleeping poorly due to this pain.  He reports he has been unable to lift heavy weight as he could before the fall.  With further questioning patient also admits to shortness of breath on exertion since the fall as well and that this has been getting worse with time.  He becomes short of breath going up stairs and did not in the past before the fall.  He denies shortness of breath at rest.  He also notes becoming more fatigued at work than normal and having to sit down to catch his breath.  He denies dizziness or syncope.  He denies hitting his head with the fall or losing consciousness.  He denies taking blood thinners.  He has been using ibuprofen  for mild pain relief since the fall.  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  The history is provided by the patient.  Shoulder Pain Location:  Shoulder and arm Shoulder location:  L shoulder Arm location:  L upper arm Pain details:    Quality:  Sharp and aching   Radiates to: Left axilla.   Severity:  Mild   Progression:  Worsening Dislocation: no   Foreign body present:  No foreign bodies Relieved by:  NSAIDs Associated symptoms: decreased range of motion   Fall The current episode started more than 1 week ago. The problem has been gradually worsening. Associated  symptoms include shortness of breath. Pertinent negatives include no chest pain, no abdominal pain and no headaches. The symptoms are aggravated by exertion and bending. The symptoms are relieved by rest.       Prior to Admission medications   Medication Sig Start Date End Date Taking? Authorizing Provider  diclofenac  Sodium (VOLTAREN ) 1 % GEL Apply 2 g topically 4 (four) times daily as needed. 08/08/20   Long, Fonda MATSU, MD  ibuprofen  (ADVIL ) 800 MG tablet Take 1 tablet (800 mg total) by mouth 3 (three) times daily. 08/08/20   Long, Fonda MATSU, MD  methocarbamol  (ROBAXIN ) 500 MG tablet Take 1 tablet (500 mg total) by mouth 2 (two) times daily. 03/02/19   Layden, Lindsey A, PA-C  prochlorperazine  (COMPAZINE ) 10 MG tablet Take 1 tablet (10 mg total) by mouth 2 (two) times daily as needed for nausea or vomiting (headaches). 09/09/15   Dreama Longs, MD  triamcinolone  cream (KENALOG ) 0.1 % Apply 1 application topically 2 (two) times daily. 06/16/18   Saguier, [place], PA-C    Allergies: Patient has no known allergies.    Review of Systems  Respiratory:  Positive for shortness of breath.   Cardiovascular:  Negative for chest pain.  Gastrointestinal:  Negative for abdominal pain.  Musculoskeletal:        Left shoulder  pain and left upper arm pain  Neurological:  Negative for headaches.    Updated Vital Signs BP (!) 150/77 (BP Location: Right Arm)   Pulse 71   Temp 98.1 F (36.7 C)   Resp 18   SpO2 100%   Physical Exam Vitals and nursing note reviewed.  Constitutional:      General: He is not in acute distress.    Appearance: Normal appearance. He is normal weight. He is not ill-appearing.  HENT:     Head: Normocephalic and atraumatic.  Eyes:     Extraocular Movements: Extraocular movements intact.  Cardiovascular:     Rate and Rhythm: Normal rate and regular rhythm.     Pulses: Normal pulses.     Heart sounds: Normal heart sounds.  Pulmonary:     Effort: Pulmonary effort is normal. No  respiratory distress.     Breath sounds: Normal breath sounds. No stridor. No wheezing, rhonchi or rales.     Comments: No tenderness with palpation of the left side chest wall. Chest:     Chest wall: No tenderness.  Abdominal:     General: Abdomen is flat. Bowel sounds are normal.     Palpations: Abdomen is soft.     Tenderness: There is no abdominal tenderness. There is no guarding.  Musculoskeletal:        General: Tenderness and signs of injury present. No swelling or deformity.       Arms:     Cervical back: Normal range of motion. No rigidity or tenderness.     Comments: Tenderness with palpation of inferior left scapula and left upper arm.  No obvious deformities or crepitus noted.  Patient is able to raise left upper extremity above his head but does elicit pain that extends into his left axilla when he does so.  Skin:    General: Skin is warm and dry.     Coloration: Skin is not pale.     Findings: No bruising.  Neurological:     Mental Status: He is alert and oriented to person, place, and time.     (all labs ordered are listed, but only abnormal results are displayed) Labs Reviewed - No data to display  EKG: None  Radiology: DG Shoulder Left Result Date: 04/22/2024 EXAM: 1 VIEW(S) XRAY OF THE LEFT SHOULDER 04/22/2024 04:51:48 PM COMPARISON: None available. CLINICAL HISTORY: injury FINDINGS: BONES AND JOINTS: Glenohumeral joint is normally aligned. No acute fracture or dislocation. [facility] joint is unremarkable in appearance. SOFT TISSUES: No abnormal calcifications. Visualized lung is unremarkable. IMPRESSION: 1. No acute fracture or dislocation. Electronically signed by: Rogelia Myers MD 04/22/2024 05:16 PM EST RP Workstation: [id number]   Procedures   Medications Ordered in the ED - No data to display    Patient presents to the ED for concern of left shoulder pain and shortness of breath on exertion after fall, this involves an extensive number of treatment  options, and is a complaint that carries with it a high risk of complications and morbidity.  The differential diagnosis includes pneumothorax, fracture, soft tissue injury, electrolyte imbalance, arrhythmia, heart failure, or MI.  Due to the mechanism of injury, the patient's age, and potential for distracting injury of the fall, we worked up the shortness of breath with exertion as full shortness of breath protocol evaluation.  Lab Tests:  I Ordered, and personally interpreted labs.  The pertinent results include: Blood work pending   Imaging Studies ordered:  I ordered imaging studies including left  shoulder and chest x-ray I independently visualized and interpreted imaging which showed no obvious fractures or abnormalities. I agree with the radiologist interpretation   Cardiac Monitoring:  EKG shows sinus rhythm with PAC and LVH, no evidence of STEMI.   Problem List / ED Course:  Patient is here for evaluation of left shoulder pain and shortness of breath on exertion after 10 foot fall. Due to the mechanism of injury, the patient's age, and potential for distracting injury of the fall, blood work and further imaging obtained.  Results pending.   Reevaluation:  After the interventions noted above, I reevaluated the patient and found that they have :stayed the same   Dispostion:  Blood work pending at provider signout.  Medical Decision Making Amount and/or Complexity of Data Reviewed Radiology: ordered.     Final diagnoses:  None    ED Discharge Orders     None          Rosina Almarie DELENA DEVONNA 04/22/24 1915    Lenor Hollering, MD 04/23/24 [phone]

## 2024-04-22 NOTE — ED Provider Notes (Signed)
  Physical Exam   Vitals:   04/22/24 1634 04/22/24 2053  BP: (!) 150/77 (!) 143/96  Pulse: 71 (!) 44  Resp: 18 14  Temp: 98.1 F (36.7 C) 97.8 F (36.6 C)  TempSrc:  Oral  SpO2: 100% 100%     Physical Exam  Procedures  Procedures  ED Course / MDM    Medical Decision Making Amount and/or Complexity of Data Reviewed Labs: ordered. Radiology: ordered.   Patient received at shift change from prior EDP Almarie Knee PA-C, see their note for initial history, physical exam findings, lab/imaging interpretations, and initial assessment and plan.  Patient reports of falling from a ladder approximately 3 weeks ago, he is complaining of left shoulder pain.  He notes that he is also been short of breath on exertion, specifically when walking up the stairs, which is unusual for him.  X-ray of the shoulder is reassuring, no acute fracture or dislocation. Chest x-ray is without evidence of cardiopulmonary disease. CBC with hemoglobin of 12.5, however this is improved from his most recent baseline of 11.8.  BMP within normal limits.    Prior EDP ordered troponin given concern for cardiac origin to shoulder pain given associated shortness of breath, initial troponin of 18, patient is not symptomatic with chest pain at this time and has been having shoulder pain for several weeks since his fall, at this point I do not feel it is necessary to repeat a second troponin, although I did discuss this with him however he is not in agreement to stay for second troponin, which I feel is reasonable.  Shoulder pain is likely musculoskeletal in origin given recent fall, return precautions discussed, he is appropriate for discharge at this time.     Glendia Rocky SAILOR, [place] 04/22/24 2120    Lenor Hollering, MD 04/23/24 [phone]

## 2024-04-22 NOTE — Discharge Instructions (Addendum)
 Follow-up with your PCP as needed.  Return to the emergency department if your symptoms worsen.

## 2024-04-22 NOTE — ED Triage Notes (Signed)
 Pt reports injury at work to left shoulder about 2 weeks ago. Pain with raising his arm.  No relief with OTC meds.
